# Patient Record
Sex: Male | Born: 1980 | Race: White | Hispanic: No | Marital: Single | Smoking: Current every day smoker
Health system: Southern US, Community
[De-identification: ages and names within clinical notes are randomized; demographics above are authoritative.]

## PROBLEM LIST (undated history)

## (undated) DIAGNOSIS — K649 Unspecified hemorrhoids: Secondary | ICD-10-CM

## (undated) DIAGNOSIS — Z950 Presence of cardiac pacemaker: Secondary | ICD-10-CM

## (undated) HISTORY — PX: LEG SURGERY: SHX1003

## (undated) HISTORY — PX: HERNIA REPAIR: SHX51

---

## 2009-10-28 ENCOUNTER — Emergency Department (HOSPITAL_COMMUNITY)
Admission: EM | Admit: 2009-10-28 | Discharge: 2009-10-28 | Payer: Self-pay | Source: Home / Self Care | Admitting: Emergency Medicine

## 2010-02-11 ENCOUNTER — Emergency Department (HOSPITAL_COMMUNITY)
Admission: EM | Admit: 2010-02-11 | Discharge: 2010-02-11 | Payer: Self-pay | Source: Home / Self Care | Admitting: Emergency Medicine

## 2010-03-19 ENCOUNTER — Emergency Department (HOSPITAL_COMMUNITY)
Admission: EM | Admit: 2010-03-19 | Discharge: 2010-03-19 | Disposition: A | Discharge status: Home / Self Care | Payer: Self-pay | Attending: Emergency Medicine | Admitting: Emergency Medicine

## 2010-03-19 DIAGNOSIS — K4090 Unilateral inguinal hernia, without obstruction or gangrene, not specified as recurrent: Secondary | ICD-10-CM | POA: Insufficient documentation

## 2010-04-02 ENCOUNTER — Emergency Department (HOSPITAL_COMMUNITY): Payer: Self-pay

## 2010-04-02 ENCOUNTER — Emergency Department (HOSPITAL_COMMUNITY)
Admission: EM | Admit: 2010-04-02 | Discharge: 2010-04-02 | Disposition: A | Discharge status: Home / Self Care | Payer: Self-pay | Attending: Emergency Medicine | Admitting: Emergency Medicine

## 2010-04-02 DIAGNOSIS — R1110 Vomiting, unspecified: Secondary | ICD-10-CM | POA: Insufficient documentation

## 2010-04-02 DIAGNOSIS — R109 Unspecified abdominal pain: Secondary | ICD-10-CM | POA: Insufficient documentation

## 2010-04-02 DIAGNOSIS — F319 Bipolar disorder, unspecified: Secondary | ICD-10-CM | POA: Insufficient documentation

## 2010-04-02 DIAGNOSIS — K4090 Unilateral inguinal hernia, without obstruction or gangrene, not specified as recurrent: Secondary | ICD-10-CM | POA: Insufficient documentation

## 2010-04-02 DIAGNOSIS — F988 Other specified behavioral and emotional disorders with onset usually occurring in childhood and adolescence: Secondary | ICD-10-CM | POA: Insufficient documentation

## 2010-04-17 ENCOUNTER — Other Ambulatory Visit (HOSPITAL_COMMUNITY): Payer: Self-pay

## 2010-04-18 ENCOUNTER — Other Ambulatory Visit: Payer: Self-pay | Admitting: General Surgery

## 2010-04-18 ENCOUNTER — Encounter (HOSPITAL_COMMUNITY): Payer: Self-pay | Attending: General Surgery

## 2010-04-18 DIAGNOSIS — Z01812 Encounter for preprocedural laboratory examination: Secondary | ICD-10-CM | POA: Insufficient documentation

## 2010-04-18 DIAGNOSIS — K4091 Unilateral inguinal hernia, without obstruction or gangrene, recurrent: Secondary | ICD-10-CM | POA: Insufficient documentation

## 2010-04-18 LAB — SURGICAL PCR SCREEN
MRSA, PCR: NEGATIVE
Staphylococcus aureus: POSITIVE — AB

## 2010-04-18 LAB — BASIC METABOLIC PANEL
Chloride: 108 mEq/L (ref 96–112)
GFR calc Af Amer: >60 mL/min (ref >60)
GFR calc non Af Amer: >60 mL/min (ref >60)
Potassium: 4.2 mEq/L (ref 3.5–5.1)

## 2010-04-18 LAB — CBC
Platelets: 140 10*3/uL — ABNORMAL LOW (ref 150–400)
RBC: 4.8 MIL/uL (ref 4.22–5.81)
WBC: 5.2 10*3/uL (ref 4.0–10.5)

## 2010-04-19 ENCOUNTER — Ambulatory Visit (HOSPITAL_COMMUNITY)
Admission: RE | Admit: 2010-04-19 | Discharge: 2010-04-19 | Disposition: A | Discharge status: Home / Self Care | Payer: Self-pay | Source: Ambulatory Visit | Attending: General Surgery | Admitting: General Surgery

## 2010-04-19 DIAGNOSIS — K4090 Unilateral inguinal hernia, without obstruction or gangrene, not specified as recurrent: Secondary | ICD-10-CM | POA: Insufficient documentation

## 2010-04-19 NOTE — H&P (Signed)
  Cameron Wood, Cameron Wood NO.:  [id number]  MEDICAL RECORD NO.:  [id number]           PATIENT TYPE:  LOCATION:                                 FACILITY:  PHYSICIAN:  Dalia Heading, M.D.  DATE OF BIRTH:  April 22, 1980  DATE OF ADMISSION: DATE OF DISCHARGE:  LH                             HISTORY & PHYSICAL   CHIEF COMPLAINT:  Left inguinal hernia.  HISTORY OF PRESENT ILLNESS:  The patient is a 30 year old white male who is referred for evaluation and treatment of the left inguinal hernia. It has been present for 1 month.  It is made worse with straining.  PAST MEDICAL HISTORY:  Unremarkable.  PAST SURGICAL HISTORY:  Left leg surgery.  CURRENT MEDICATIONS:  None.  ALLERGIES:  No known drug allergies.  REVIEW OF SYSTEMS:  The patient smokes a pack and half cigarettes a day. He denies any significant alcohol use.  He denies any other cardiopulmonary difficulties or bleeding disorders.  FAMILY MEDICAL HISTORY:  Noncontributory.  PHYSICAL EXAMINATION:  GENERAL:  The patient is a well-developed, well- nourished white male, in no acute distress. HEENT:  No scleral icterus. NECK:  Supple without lymphadenopathy. LUNGS:  Clear to auscultation with equal breath sounds bilaterally. HEART:  Regular rate and rhythm without S3, S4, or murmurs. ABDOMEN:  Soft, nontender, nondistended.  No hepatosplenomegaly or masses are noted.  Reducible left inguinal hernia is present. GENITOURINARY:  Within normal limits.  IMPRESSION:  Left inguinal hernia.  PLAN:  The patient is scheduled for a left inguinal herniorrhaphy on April 19, 2010.  The risks and benefits of the procedure including bleeding, infection, and recurrence of the hernia were fully explained to the patient, gave informed consent.     Dalia Heading, M.D.     MAJ/MEDQ  D:  04/18/2010  T:  04/19/2010  Job:  [id number]  Electronically Signed by Franky Macho M.D. on 04/19/2010 11:08:54 AM

## 2010-04-21 ENCOUNTER — Emergency Department (HOSPITAL_COMMUNITY)
Admission: EM | Admit: 2010-04-21 | Discharge: 2010-04-22 | Disposition: A | Discharge status: Home / Self Care | Payer: Self-pay | Attending: Emergency Medicine | Admitting: Emergency Medicine

## 2010-04-21 DIAGNOSIS — F319 Bipolar disorder, unspecified: Secondary | ICD-10-CM | POA: Insufficient documentation

## 2010-04-21 DIAGNOSIS — F988 Other specified behavioral and emotional disorders with onset usually occurring in childhood and adolescence: Secondary | ICD-10-CM | POA: Insufficient documentation

## 2010-04-21 DIAGNOSIS — R110 Nausea: Secondary | ICD-10-CM | POA: Insufficient documentation

## 2010-04-21 DIAGNOSIS — X58XXXA Exposure to other specified factors, initial encounter: Secondary | ICD-10-CM | POA: Insufficient documentation

## 2010-04-21 DIAGNOSIS — R109 Unspecified abdominal pain: Secondary | ICD-10-CM | POA: Insufficient documentation

## 2010-04-21 DIAGNOSIS — Y929 Unspecified place or not applicable: Secondary | ICD-10-CM | POA: Insufficient documentation

## 2010-04-21 DIAGNOSIS — Z9889 Other specified postprocedural states: Secondary | ICD-10-CM | POA: Insufficient documentation

## 2010-04-21 DIAGNOSIS — IMO0002 Reserved for concepts with insufficient information to code with codable children: Secondary | ICD-10-CM | POA: Insufficient documentation

## 2010-04-24 NOTE — Op Note (Signed)
  NAMEJAQWON, MANFRED NO.:  [id number]  MEDICAL RECORD NO.:  [id number]           PATIENT TYPE:  O  LOCATION:  DOIB                          FACILITY:  [facility]  PHYSICIAN:  Dalia Heading, M.D.  DATE OF BIRTH:  [birth year]  DATE OF PROCEDURE:  04/19/2010 DATE OF DISCHARGE:                              OPERATIVE REPORT   PREOPERATIVE DIAGNOSIS:  Left inguinal hernia.  POSTOPERATIVE DIAGNOSIS:  Left inguinal hernia, indirect.  PROCEDURE:  Left inguinal herniorrhaphy.  SURGEON:  Dalia Heading, MD  ANESTHESIA:  General.  INDICATIONS:  The patient is a 30 year old white male who presents with a symptomatic left inguinal hernia.  The risks and benefits of the procedure including bleeding, infection, pain, and recurrence of the hernia were fully explained to the patient, gave informed consent.  PROCEDURE NOTE:  The patient was placed in supine position.  After general anesthesia was administered, the left groin region was prepped and draped using the usual sterile technique with Betadine.  Surgical site confirmation was performed.  A transverse incision was made in the left groin region down to the external oblique aponeurosis.  The aponeurosis was incised to the external ring.  The ilioinguinal nerve was identified and retracted inferiorly from the operative field.  Penrose drain was placed around the spermatic cord.  The vase deferens was noted within the spermatic cord.  A lipoma of cord was excised.  An indirect hernia was found. This was freed away from the spermatic cord up to the perineal reflection and inverted.  A medium-size Bard mesh plug was then placed in this region and secured to the transversalis fascia using a 2-0 Novafil interrupted suture.  An onlay patch was then placed along the floor of the inguinal canal and secured superiorly to the conjoined tendon and inferiorly to the shelving edge Poupart's ligament using 2-0 Novafil interrupted  sutures.  The internal ring was recreated using a 2- 0 Novafil interrupted suture.  The external oblique aponeurosis was reapproximated using 2-0 Vicryl running suture.  Subcutaneous layer was reapproximated using 3-0 Vicryl interrupted sutures.  The skin was closed using a 4-0 Vicryl subcuticular suture.  A 0.5 cm Sensorcaine was instilled in the surrounding wound.  Dermabond was then applied.  All tape and needle counts were correct at the end of the procedure. The patient was awakened and transferred to PACU in stable condition.  COMPLICATIONS:  None.  SPECIMEN:  None.  BLOOD LOSS:  Minimal.     Dalia Heading, M.D.     MAJ/MEDQ  D:  04/19/2010  T:  04/20/2010  Job:  [id number]  Electronically Signed by Franky Macho M.D. on 04/24/2010 08:50:12 AM

## 2011-07-10 ENCOUNTER — Emergency Department (HOSPITAL_COMMUNITY)
Admission: EM | Admit: 2011-07-10 | Discharge: 2011-07-11 | Disposition: A | Discharge status: Home / Self Care | Payer: Self-pay | Attending: Emergency Medicine | Admitting: Emergency Medicine

## 2011-07-10 ENCOUNTER — Encounter (HOSPITAL_COMMUNITY): Payer: Self-pay | Admitting: Emergency Medicine

## 2011-07-10 DIAGNOSIS — K0889 Other specified disorders of teeth and supporting structures: Secondary | ICD-10-CM

## 2011-07-10 DIAGNOSIS — K029 Dental caries, unspecified: Secondary | ICD-10-CM | POA: Insufficient documentation

## 2011-07-10 DIAGNOSIS — F17200 Nicotine dependence, unspecified, uncomplicated: Secondary | ICD-10-CM | POA: Insufficient documentation

## 2011-07-10 NOTE — ED Notes (Signed)
Patient c/o right lower back tooth pain for months.  Patient has broken molar noted.

## 2011-07-11 MED ORDER — PENICILLIN V POTASSIUM 250 MG PO TABS
500.0000 mg | ORAL_TABLET | Freq: Once | ORAL | Status: AC
  Administered 2011-07-11: 500 mg via ORAL
  Filled 2011-07-11: qty 2

## 2011-07-11 MED ORDER — IBUPROFEN 800 MG PO TABS
800.0000 mg | ORAL_TABLET | Freq: Once | ORAL | Status: AC
  Administered 2011-07-11: 800 mg via ORAL
  Filled 2011-07-11: qty 1

## 2011-07-11 MED ORDER — HYDROCODONE-ACETAMINOPHEN 5-325 MG PO TABS: 1.0000 | ORAL_TABLET | ORAL | Status: AC | PRN

## 2011-07-11 MED ORDER — IBUPROFEN 800 MG PO TABS: 800.0000 mg | ORAL_TABLET | Freq: Three times a day (TID) | ORAL | Status: AC

## 2011-07-11 MED ORDER — HYDROCODONE-ACETAMINOPHEN 5-325 MG PO TABS
1.0000 | ORAL_TABLET | Freq: Once | ORAL | Status: AC
  Administered 2011-07-11: 1 via ORAL
  Filled 2011-07-11: qty 1

## 2011-07-11 MED ORDER — PENICILLIN V POTASSIUM 500 MG PO TABS: 500.0000 mg | ORAL_TABLET | Freq: Four times a day (QID) | ORAL | Status: AC

## 2011-07-11 NOTE — Discharge Instructions (Signed)
Dental Pain Toothache is pain in or around a tooth. It may get worse with chewing or with cold or heat.  HOME CARE  Your dentist may use a numbing medicine during treatment. If so, you may need to avoid eating until the medicine wears off. Ask your dentist about this.   Only take medicine as told by your dentist or doctor.   Avoid chewing food near the painful tooth until after all treatment is done. Ask your dentist about this.  GET HELP RIGHT AWAY IF:   The problem gets worse or new problems appear.   You have a fever.   There is redness and puffiness (swelling) of the face, jaw, or neck.   You cannot open your mouth.   There is pain in the jaw.   There is very bad pain that is not helped by medicine.  MAKE SURE YOU:   Understand these instructions.   Will watch your condition.   Will get help right away if you are not doing well or get worse.  Document Released: 06/20/2007 Document Revised: 12/21/2010 Document Reviewed: 06/20/2007 [facility] Patient Information 2012 [place], [place].

## 2011-07-11 NOTE — ED Provider Notes (Signed)
History     CSN: [id number]  Arrival date & time 07/10/11  2320   First MD Initiated Contact with Patient 07/10/11 2334      Chief Complaint  Patient presents with  . Dental Pain    (Consider location/radiation/quality/duration/timing/severity/associated sxs/prior treatment) HPI Cameron Wood is a 31 y.o. male who presents to the Emergency Department complaining of dental pain that has been present for several weeks. He has one molar on the right lower jaw that is blackened, broken and the source of his pain. He denies fever, chills, difficulty talking or swallowing. Pain is worse with chewing. He is taking BC powders with no relief.   History reviewed. No pertinent past medical history.  Past Surgical History  Procedure Date  . Hernia repair     No family history on file.  History  Substance Use Topics  . Smoking status: Current Everyday Smoker  . Smokeless tobacco: Not on file  . Alcohol Use: Yes     occ      Review of Systems  Constitutional: Negative for fever.       10 Systems reviewed and are negative for acute change except as noted in the HPI.  HENT: Negative for congestion.        Dental pain  Eyes: Negative for discharge and redness.  Respiratory: Negative for cough and shortness of breath.   Cardiovascular: Negative for chest pain.  Gastrointestinal: Negative for vomiting and abdominal pain.  Musculoskeletal: Negative for back pain.  Skin: Negative for rash.  Neurological: Negative for syncope, numbness and headaches.  Psychiatric/Behavioral:       No behavior change.    Allergies  Review of patient's allergies indicates no known allergies.  Home Medications  No current outpatient prescriptions on file.  BP 130/86  Pulse 88  Temp 99 F (37.2 C) (Oral)  Resp 20  Ht 6' (1.829 m)  Wt 170 lb (77.111 kg)  BMI 23.06 kg/m2  SpO2 97%  Physical Exam  Nursing note and vitals reviewed. Constitutional: He appears well-developed and  well-nourished. No distress.       Awake, alert, nontoxic appearance.  HENT:  Head: Atraumatic.       Poor dentition. Multiple caries. Sole tooth on right lower jaw blackened, broken, no obvious abscess, no drainage, no swollen gums.  Eyes: Right eye exhibits no discharge. Left eye exhibits no discharge.  Neck: Neck supple.  Cardiovascular: Normal heart sounds.   Pulmonary/Chest: Effort normal and breath sounds normal. He exhibits no tenderness.  Abdominal: Soft. There is no tenderness. There is no rebound.  Musculoskeletal: He exhibits no tenderness.       Baseline ROM, no obvious new focal weakness.  Neurological:       Mental status and motor strength appears baseline for patient and situation.  Skin: No rash noted.  Psychiatric: He has a normal mood and affect.    ED Course  Procedures (including critical care time)     MDM  Patient with poor dentition here with pain to the right lower jaw molar is broken and blackened. Given antibiotics, anti-inflammatory, and analgesic. Referral list of dentists given to the patient.Pt stable in ED with no significant deterioration in condition.The patient appears reasonably screened and/or stabilized for discharge and I doubt any other medical condition or other [facility] requiring further screening, evaluation, or treatment in the ED at this time prior to discharge.  MDM Reviewed: nursing note and vitals          Aurther Loft  Velia Meyer, MD 07/11/11 [phone]

## 2011-08-14 DIAGNOSIS — F17200 Nicotine dependence, unspecified, uncomplicated: Secondary | ICD-10-CM | POA: Insufficient documentation

## 2011-08-14 DIAGNOSIS — K029 Dental caries, unspecified: Secondary | ICD-10-CM | POA: Insufficient documentation

## 2011-08-15 ENCOUNTER — Encounter (HOSPITAL_COMMUNITY): Payer: Self-pay | Admitting: Emergency Medicine

## 2011-08-15 ENCOUNTER — Emergency Department (HOSPITAL_COMMUNITY)
Admission: EM | Admit: 2011-08-15 | Discharge: 2011-08-15 | Disposition: A | Discharge status: Home / Self Care | Payer: Self-pay | Attending: Emergency Medicine | Admitting: Emergency Medicine

## 2011-08-15 DIAGNOSIS — T85848A Pain due to other internal prosthetic devices, implants and grafts, initial encounter: Secondary | ICD-10-CM

## 2011-08-15 MED ORDER — IBUPROFEN 800 MG PO TABS
800.0000 mg | ORAL_TABLET | Freq: Once | ORAL | Status: AC
  Administered 2011-08-15: 800 mg via ORAL
  Filled 2011-08-15: qty 1

## 2011-08-15 MED ORDER — HYDROCODONE-ACETAMINOPHEN 5-325 MG PO TABS
1.0000 | ORAL_TABLET | Freq: Once | ORAL | Status: AC
  Administered 2011-08-15: 1 via ORAL
  Filled 2011-08-15: qty 1

## 2011-08-15 MED ORDER — HYDROCODONE-ACETAMINOPHEN 5-325 MG PO TABS: 1.0000 | ORAL_TABLET | ORAL | Status: AC | PRN

## 2011-08-15 NOTE — ED Notes (Signed)
Discharge instructions reviewed with pt, questions answered. Pt verbalized understanding.  

## 2011-08-15 NOTE — ED Notes (Signed)
Patient complaining of lower right dental pain since yesterday. 

## 2011-08-15 NOTE — ED Provider Notes (Signed)
History     CSN: [id number]  Arrival date & time 08/14/11  2357   First MD Initiated Contact with Patient 08/15/11 0019      Chief Complaint  Patient presents with  . Dental Pain    (Consider location/radiation/quality/duration/timing/severity/associated sxs/prior treatment) HPI  Cameron Wood is a 31 y.o. male who presents to the Emergency Department complaining of renewed pain to the right lower molar that is broken, blackened and been the source of dental pain for several months. He was seen for similar pain on 07/10/11 when he was given antibiotics, antiinflammatory and analgesic. He did not finish the antibiotic because it made him feel "weird". He had been given a list of dentists in the area and has not followed up with a dentist. He is here with pain that became worse yesterday.    History reviewed. No pertinent past medical history.  Past Surgical History  Procedure Date  . Hernia repair     History reviewed. No pertinent family history.  History  Substance Use Topics  . Smoking status: Current Everyday Smoker  . Smokeless tobacco: Not on file  . Alcohol Use: Yes     occ      Review of Systems  Constitutional: Negative for fever.       10 Systems reviewed and are negative for acute change except as noted in the HPI.  HENT: Negative for congestion.        Right lower tooth pain  Eyes: Negative for discharge and redness.  Respiratory: Negative for cough and shortness of breath.   Cardiovascular: Negative for chest pain.  Gastrointestinal: Negative for vomiting and abdominal pain.  Musculoskeletal: Negative for back pain.  Skin: Negative for rash.  Neurological: Negative for syncope, numbness and headaches.  Psychiatric/Behavioral:       No behavior change.    Allergies  Penicillins  Home Medications  No current outpatient prescriptions on file.  BP 149/88  Pulse 79  Temp 98.3 F (36.8 C) (Oral)  Resp 18  Ht 6' (1.829 m)  Wt 170 lb (77.111  kg)  BMI 23.06 kg/m2  SpO2 96%  Physical Exam  Nursing note and vitals reviewed. Constitutional:       Awake, alert, nontoxic appearance.  HENT:  Head: Atraumatic.       Poor dentition with multiple caries. Right lower 2nd molar broken, blackened. No obvious abscess noted. No drainage. No swollen gums.  Eyes: Right eye exhibits no discharge. Left eye exhibits no discharge.  Neck: Neck supple.  Cardiovascular: Normal heart sounds.   Pulmonary/Chest: Effort normal and breath sounds normal. He exhibits no tenderness.  Abdominal: Soft. There is no tenderness. There is no rebound.  Musculoskeletal: He exhibits no tenderness.       Baseline ROM, no obvious new focal weakness.  Neurological:       Mental status and motor strength appears baseline for patient and situation.  Skin: No rash noted.  Psychiatric: He has a normal mood and affect.    ED Course  Procedures (including critical care time)     MDM  Patient with poor dentition and recurrent pain. Given a referral list of dentist. Received an antiinflammatory and analgesic. Rx for analgesic.Pt stable in ED with no significant deterioration in condition.The patient appears reasonably screened and/or stabilized for discharge and I doubt any other medical condition or other [facility] requiring further screening, evaluation, or treatment in the ED at this time prior to discharge.  MDM Reviewed: nursing note, vitals and  previous chart           Nicoletta Dress. Colon Branch, MD 08/15/11 4540

## 2011-10-02 ENCOUNTER — Emergency Department (HOSPITAL_COMMUNITY)
Admission: EM | Admit: 2011-10-02 | Discharge: 2011-10-02 | Disposition: A | Discharge status: Home / Self Care | Payer: Self-pay | Attending: Emergency Medicine | Admitting: Emergency Medicine

## 2011-10-02 ENCOUNTER — Encounter (HOSPITAL_COMMUNITY): Payer: Self-pay

## 2011-10-02 DIAGNOSIS — R109 Unspecified abdominal pain: Secondary | ICD-10-CM

## 2011-10-02 DIAGNOSIS — F17200 Nicotine dependence, unspecified, uncomplicated: Secondary | ICD-10-CM | POA: Insufficient documentation

## 2011-10-02 DIAGNOSIS — R1012 Left upper quadrant pain: Secondary | ICD-10-CM | POA: Insufficient documentation

## 2011-10-02 LAB — CBC WITH DIFFERENTIAL/PLATELET
Basophils Relative: 1 % (ref 0–1)
Hemoglobin: 14.6 g/dL (ref 13.0–17.0)
Lymphs Abs: 2.7 10*3/uL (ref 0.7–4.0)
MCHC: 34.5 g/dL (ref 30.0–36.0)
Monocytes Relative: 6 % (ref 3–12)
Neutro Abs: 3.1 10*3/uL (ref 1.7–7.7)
Neutrophils Relative %: 45 % (ref 43–77)
Platelets: 151 10*3/uL (ref 150–400)
RBC: 4.95 MIL/uL (ref 4.22–5.81)

## 2011-10-02 LAB — URINALYSIS, ROUTINE W REFLEX MICROSCOPIC
Hgb urine dipstick: NEGATIVE
Leukocytes, UA: NEGATIVE
Protein, ur: NEGATIVE mg/dL
Specific Gravity, Urine: >1.03 — ABNORMAL HIGH (ref 1.005–1.030)
Urobilinogen, UA: 0.2 mg/dL (ref 0.0–1.0)

## 2011-10-02 LAB — COMPREHENSIVE METABOLIC PANEL
ALT: 23 U/L (ref 0–53)
AST: 19 U/L (ref 0–37)
Albumin: 4.5 g/dL (ref 3.5–5.2)
Alkaline Phosphatase: 73 U/L (ref 39–117)
Calcium: 9.7 mg/dL (ref 8.4–10.5)
GFR calc Af Amer: >90 mL/min (ref >90)
Glucose, Bld: 87 mg/dL (ref 70–99)
Potassium: 4 mEq/L (ref 3.5–5.1)
Sodium: 139 mEq/L (ref 135–145)
Total Protein: 7.7 g/dL (ref 6.0–8.3)

## 2011-10-02 MED ORDER — HYDROCODONE-ACETAMINOPHEN 5-325 MG PO TABS: 1.0000 | ORAL_TABLET | ORAL | Status: DC | PRN

## 2011-10-02 MED ORDER — ONDANSETRON HCL 4 MG/2ML IJ SOLN
4.0000 mg | Freq: Once | INTRAMUSCULAR | Status: AC
  Administered 2011-10-02: 4 mg via INTRAVENOUS
  Filled 2011-10-02: qty 2

## 2011-10-02 MED ORDER — HYDROMORPHONE HCL PF 1 MG/ML IJ SOLN
1.0000 mg | Freq: Once | INTRAMUSCULAR | Status: AC
  Administered 2011-10-02: 1 mg via INTRAVENOUS
  Filled 2011-10-02: qty 1

## 2011-10-02 MED ORDER — SODIUM CHLORIDE 0.9 % IV BOLUS (SEPSIS)
1000.0000 mL | Freq: Once | INTRAVENOUS | Status: AC
  Administered 2011-10-02: 1000 mL via INTRAVENOUS

## 2011-10-02 MED ORDER — SODIUM CHLORIDE 0.9 % IV SOLN: INTRAVENOUS | Status: DC

## 2011-10-02 NOTE — ED Provider Notes (Signed)
History     CSN: [id number]  Arrival date & time 10/02/11  1721   First MD Initiated Contact with Patient 10/02/11 1813      Chief Complaint  Patient presents with  . Abdominal Pain  . Nausea    (Consider location/radiation/quality/duration/timing/severity/associated sxs/prior treatment) HPI  Vague left upper quadrant pain for 24 hours.  No fever, nausea, vomiting, diarrhea.  Decreased appetite today. Nothing makes symptoms better or worse. Severity is mild to moderate. History reviewed. No pertinent past medical history.  Past Surgical History  Procedure Date  . Hernia repair   . Leg surgery     No family history on file.  History  Substance Use Topics  . Smoking status: Current Every Day Smoker  . Smokeless tobacco: Not on file  . Alcohol Use: Yes     occ      Review of Systems  All other systems reviewed and are negative.    Allergies  Penicillins  Home Medications   Current Outpatient Rx  Name Route Sig Dispense Refill  . HYDROCODONE-ACETAMINOPHEN 5-325 MG PO TABS Oral Take 1-2 tablets by mouth every 4 (four) hours as needed for pain. 15 tablet 0    BP 116/66  Pulse 52  Temp 98.5 F (36.9 C) (Oral)  Resp 18  Ht 5\' 11"  (1.803 m)  Wt 160 lb (72.576 kg)  BMI 22.32 kg/m2  SpO2 97%  Physical Exam  Nursing note and vitals reviewed. Constitutional: He is oriented to person, place, and time. He appears well-developed and well-nourished.  HENT:  Head: Normocephalic and atraumatic.  Eyes: Conjunctivae normal and EOM are normal. Pupils are equal, round, and reactive to light.  Neck: Normal range of motion. Neck supple.  Cardiovascular: Normal rate, regular rhythm and normal heart sounds.   Pulmonary/Chest: Effort normal and breath sounds normal.  Abdominal:       Very minimal left upper quadrant pain  Musculoskeletal: Normal range of motion.  Neurological: He is alert and oriented to person, place, and time.  Skin: Skin is warm and dry.    Psychiatric: He has a normal mood and affect.    ED Course  Procedures (including critical care time)  Labs Reviewed  CBC WITH DIFFERENTIAL - Abnormal; Notable for the following:    Eosinophils Relative 8 (*)     All other components within normal limits  URINALYSIS, ROUTINE W REFLEX MICROSCOPIC - Abnormal; Notable for the following:    Specific Gravity, Urine >1.030 (*)     All other components within normal limits  COMPREHENSIVE METABOLIC PANEL  LIPASE, BLOOD   No results found.   1. Abdominal pain       MDM  No acute abdomen. Screening tests were negative. Patient is ambulatory without obvious pain.  Normal vital signs        Donnetta Hutching, MD 10/02/11 2135

## 2011-10-02 NOTE — ED Notes (Signed)
Pt reports took 3 ibuprofen last night for toothache, says didn't help so he took one of his mother's vicodin.  Reports history of bloody stools but non today or yesterday.  Says has been having blood in stool intermittently since his surgery.

## 2011-10-02 NOTE — ED Notes (Signed)
Pt c/o abd pain with nausea since yesterday.   Denies vomiting.  Reports diarrhea after every meal since having hernia repaired 2 years ago.

## 2011-10-03 ENCOUNTER — Emergency Department (HOSPITAL_COMMUNITY)
Admission: EM | Admit: 2011-10-03 | Discharge: 2011-10-03 | Disposition: A | Discharge status: Home / Self Care | Payer: Self-pay | Attending: Emergency Medicine | Admitting: Emergency Medicine

## 2011-10-03 ENCOUNTER — Emergency Department (HOSPITAL_COMMUNITY): Payer: Self-pay

## 2011-10-03 ENCOUNTER — Encounter (HOSPITAL_COMMUNITY): Payer: Self-pay | Admitting: Emergency Medicine

## 2011-10-03 DIAGNOSIS — R112 Nausea with vomiting, unspecified: Secondary | ICD-10-CM | POA: Insufficient documentation

## 2011-10-03 DIAGNOSIS — R109 Unspecified abdominal pain: Secondary | ICD-10-CM

## 2011-10-03 DIAGNOSIS — K279 Peptic ulcer, site unspecified, unspecified as acute or chronic, without hemorrhage or perforation: Secondary | ICD-10-CM | POA: Insufficient documentation

## 2011-10-03 LAB — CBC WITH DIFFERENTIAL/PLATELET
Basophils Relative: 1 % (ref 0–1)
Eosinophils Absolute: 0.5 10*3/uL (ref 0.0–0.7)
HCT: 38.7 % — ABNORMAL LOW (ref 39.0–52.0)
Hemoglobin: 13.2 g/dL (ref 13.0–17.0)
Lymphs Abs: 1.7 10*3/uL (ref 0.7–4.0)
MCH: 29.1 pg (ref 26.0–34.0)
MCHC: 34.1 g/dL (ref 30.0–36.0)
MCV: 85.4 fL (ref 78.0–100.0)
Monocytes Absolute: 0.4 10*3/uL (ref 0.1–1.0)
Monocytes Relative: 6 % (ref 3–12)
Neutrophils Relative %: 58 % (ref 43–77)
RBC: 4.53 MIL/uL (ref 4.22–5.81)

## 2011-10-03 LAB — COMPREHENSIVE METABOLIC PANEL
Albumin: 3.8 g/dL (ref 3.5–5.2)
Alkaline Phosphatase: 63 U/L (ref 39–117)
BUN: 11 mg/dL (ref 6–23)
Creatinine, Ser: 0.96 mg/dL (ref 0.50–1.35)
GFR calc Af Amer: >90 mL/min (ref >90)
Glucose, Bld: 92 mg/dL (ref 70–99)
Potassium: 4 mEq/L (ref 3.5–5.1)
Total Bilirubin: 0.4 mg/dL (ref 0.3–1.2)
Total Protein: 6.8 g/dL (ref 6.0–8.3)

## 2011-10-03 LAB — LIPASE, BLOOD: Lipase: 24 U/L (ref 11–59)

## 2011-10-03 MED ORDER — HYDROMORPHONE HCL PF 1 MG/ML IJ SOLN
1.0000 mg | Freq: Once | INTRAMUSCULAR | Status: AC
  Administered 2011-10-03: 1 mg via INTRAVENOUS
  Filled 2011-10-03: qty 1

## 2011-10-03 MED ORDER — ONDANSETRON HCL 4 MG/2ML IJ SOLN
4.0000 mg | Freq: Once | INTRAMUSCULAR | Status: AC
  Administered 2011-10-03: 4 mg via INTRAVENOUS
  Filled 2011-10-03: qty 2

## 2011-10-03 MED ORDER — OXYCODONE-ACETAMINOPHEN 5-325 MG PO TABS: 1.0000 | ORAL_TABLET | Freq: Four times a day (QID) | ORAL | Status: AC | PRN

## 2011-10-03 MED ORDER — RANITIDINE HCL 150 MG PO CAPS: 150.0000 mg | ORAL_CAPSULE | Freq: Two times a day (BID) | ORAL | Status: AC

## 2011-10-03 MED ORDER — IOHEXOL 300 MG/ML  SOLN
100.0000 mL | Freq: Once | INTRAMUSCULAR | Status: AC | PRN
  Administered 2011-10-03: 100 mL via INTRAVENOUS

## 2011-10-03 MED ORDER — PANTOPRAZOLE SODIUM 40 MG IV SOLR
40.0000 mg | Freq: Once | INTRAVENOUS | Status: AC
  Administered 2011-10-03: 40 mg via INTRAVENOUS
  Filled 2011-10-03: qty 40

## 2011-10-03 NOTE — Discharge Instructions (Signed)
Peptic Ulcers Ulcers are small, open, and painful sores that form in the lining of the stomach (gastric ulcers). They can also form in the first part of the small intestine (duodenal ulcers). A peptic ulcer describes both types of ulcers. Ulcers often heal by taking medicine or changing what you eat. Surgery is only needed if the pain cannot be controlled or if tearing, blockage, or bleeding is found. HOME CARE  Stop smoking.   Avoid alcohol.  Avoid aspirin and drugs that lessen puffiness (swelling) and soreness. Peptic Ulcers Ulcers are small, open, and painful sores that form in the lining of the stomach (gastric ulcers). They can also form in the first part of the small intestine (duodenal ulcers). A peptic ulcer describes both types of ulcers. Ulcers often heal by taking medicine or changing what you eat. Surgery is only needed if the pain cannot be controlled or if tearing, blockage, or bleeding is found. HOME CARE Stop smoking.  Avoid alcohol.  Avoid aspirin and drugs that lessen puffiness (swelling) and soreness.  Eat regular, healthy meals.  Avoid foods that bother you.  Only take medicine as told by your doctor.  Always ask your doctor first before switching brands of antacid medicine.  GET HELP RIGHT AWAY IF: You have bright red blood in your throw up (vomit).  You have bloody, tarry, or black looking poop (stool).  You feel weak, tired, or pass out (faint).  You have sudden belly (abdominal) pain that hurts really bad.  You have bad pain and keep throwing up.  MAKE SURE YOU:  Understand these instructions.  Will watch your condition.  Will get help right away if you are not doing well or get worse.  Document Released: 03/28/2009 Document Revised: 12/21/2010 Document Reviewed: 03/28/2009 [facility] Patient Information 2012 [place], [place].Peptic Ulcers Ulcers are small, open, and painful sores that form in the lining of the stomach (gastric ulcers). They can also form in the  first part of the small intestine (duodenal ulcers). A peptic ulcer describes both types of ulcers. Ulcers often heal by taking medicine or changing what you eat. Surgery is only needed if the pain cannot be controlled or if tearing, blockage, or bleeding is found. HOME CARE Stop smoking.  Avoid alcohol.  Avoid aspirin and drugs that lessen puffiness (swelling) and soreness.  Eat regular, healthy meals.  Avoid foods that bother you.  Only take medicine as told by your doctor.  Always ask your doctor first before switching brands of antacid medicine.  GET HELP RIGHT AWAY IF: You have bright red blood in your throw up (vomit).  You have bloody, tarry, or black looking poop (stool).  You feel weak, tired, or pass out (faint).  You have sudden belly (abdominal) pain that hurts really bad.  You have bad pain and keep throwing up.  MAKE SURE YOU:  Understand these instructions.  Will watch your condition.  Will get help right away if you are not doing well or get worse.  Document Released: 03/28/2009 Document Revised: 12/21/2010 Document Reviewed: 03/28/2009  [facility] Patient Information 2012 [place], [place].  Eat regular, healthy meals.   Avoid foods that bother you.   Only take medicine as told by your doctor.   Always ask your doctor first before switching brands of antacid medicine.  GET HELP RIGHT AWAY IF:  You have bright red blood in your throw up (vomit).   You have bloody, tarry, or black looking poop (stool).   You feel weak, tired, or pass  out (faint).   You have sudden belly (abdominal) pain that hurts really bad.   You have bad pain and keep throwing up.  MAKE SURE YOU:   Understand these instructions.   Will watch your condition.   Will get help right away if you are not doing well or get worse.  Document Released: 03/28/2009 Document Revised: 12/21/2010 Document Reviewed: 03/28/2009 [facility] Patient Information 2012 [place], [place].

## 2011-10-03 NOTE — ED Provider Notes (Signed)
History  This chart was scribed for [name], MD by Shari Heritage. The patient was seen in room APA06/APA06. Patient's care was started at 1112.     CSN: [id number]  Arrival date & time 10/03/11  1003   First MD Initiated Contact with Patient 10/03/11 1112      Chief Complaint  Patient presents with  . Abdominal Pain    Patient is a 31 y.o. male presenting with abdominal pain. The history is provided by the patient and medical records. No language interpreter was used.  Abdominal Pain The primary symptoms of the illness include abdominal pain, nausea and vomiting. The primary symptoms of the illness do not include fatigue or diarrhea. The current episode started 2 days ago. The onset of the illness was sudden. The problem has not changed since onset. The abdominal pain began 2 days ago. The pain came on suddenly. The abdominal pain has been unchanged since its onset. The abdominal pain is generalized. The abdominal pain does not radiate. The abdominal pain is relieved by nothing.  The vomiting began today. Vomiting occurred once. The emesis contains bilious material.  Symptoms associated with the illness do not include hematuria, frequency or back pain.    Cameron Wood is a 31 y.o. male who presents to the Emergency Department complaining of moderate, constant, non-radiating diffuse abdominal pain onset 2 days ago. Associated symptoms include nausea and 1 episode of emesis containing "acid." Patient reports no aggravating or relieving factors. Patient reports no significant medical history. He has a surgical history of hernia repair and leg surgery. Patient was seen here yesterday by Dr. Eber Hong for the same. He was discharged in stable condition after tests and labs showed no evidence of an acute abdominal condition. Patient doesn't drink alcohol regularly. He is a current every day smoker.  History reviewed. No pertinent past medical history.  Past Surgical History    Procedure Date  . Hernia repair   . Leg surgery     No family history on file.  History  Substance Use Topics  . Smoking status: Current Every Day Smoker  . Smokeless tobacco: Not on file  . Alcohol Use: Yes     occ      Review of Systems  Constitutional: Negative for fatigue.  HENT: Negative for congestion, sinus pressure and ear discharge.   Eyes: Negative for discharge.  Respiratory: Negative for cough.   Cardiovascular: Negative for chest pain.  Gastrointestinal: Positive for nausea, vomiting and abdominal pain. Negative for diarrhea.  Genitourinary: Negative for frequency and hematuria.  Musculoskeletal: Negative for back pain.  Skin: Negative for rash.  Neurological: Negative for seizures and headaches.  Hematological: Negative.   Psychiatric/Behavioral: Negative for hallucinations.    Allergies  Coconut flavor; Penicillins; Tomato; and Latex  Home Medications  No current outpatient prescriptions on file.  BP 112/71  Pulse 72  Temp 98.6 F (37 C) (Oral)  Resp 18  Ht 5' 11.5" (1.816 m)  Wt 160 lb (72.576 kg)  BMI 22.00 kg/m2  SpO2 100%  Physical Exam  Constitutional: He is oriented to person, place, and time. He appears well-developed.  HENT:  Head: Normocephalic and atraumatic.  Eyes: Conjunctivae normal and EOM are normal. No scleral icterus.  Neck: Neck supple. No thyromegaly present.  Cardiovascular: Normal rate and regular rhythm.  Exam reveals no gallop and no friction rub.   No murmur heard. Pulmonary/Chest: No stridor. He has no wheezes. He has no rales. He exhibits no tenderness.  Abdominal: He exhibits no distension. There is no tenderness. There is no rebound.  Musculoskeletal: Normal range of motion. He exhibits no edema.  Lymphadenopathy:    He has no cervical adenopathy.  Neurological: He is oriented to person, place, and time. Coordination normal.  Skin: No rash noted. No erythema.  Psychiatric: He has a normal mood and affect. His  behavior is normal.    ED Course  Procedures (including critical care time) DIAGNOSTIC STUDIES: Oxygen Saturation is 100% on room air, normal by my interpretation.    COORDINATION OF CARE: 11:16am- Patient informed of current plan for treatment and evaluation and agrees with plan at this time.  Results for orders placed during the hospital encounter of 10/03/11  CBC WITH DIFFERENTIAL      Component Value Range   WBC 6.3  4.0 - 10.5 K/uL   RBC 4.53  4.22 - 5.81 MIL/uL   Hemoglobin 13.2  13.0 - 17.0 g/dL   HCT 28.4 (*) 13.2 - 44.0 %   MCV 85.4  78.0 - 100.0 fL   MCH 29.1  26.0 - 34.0 pg   MCHC 34.1  30.0 - 36.0 g/dL   RDW 10.2  72.5 - 36.6 %   Platelets 136 (*) 150 - 400 K/uL   Neutrophils Relative 58  43 - 77 %   Neutro Abs 3.7  1.7 - 7.7 K/uL   Lymphocytes Relative 27  12 - 46 %   Lymphs Abs 1.7  0.7 - 4.0 K/uL   Monocytes Relative 6  3 - 12 %   Monocytes Absolute 0.4  0.1 - 1.0 K/uL   Eosinophils Relative 7 (*) 0 - 5 %   Eosinophils Absolute 0.5  0.0 - 0.7 K/uL   Basophils Relative 1  0 - 1 %   Basophils Absolute 0.0  0.0 - 0.1 K/uL  COMPREHENSIVE METABOLIC PANEL      Component Value Range   Sodium 139  135 - 145 mEq/L   Potassium 4.0  3.5 - 5.1 mEq/L   Chloride 105  96 - 112 mEq/L   CO2 28  19 - 32 mEq/L   Glucose, Bld 92  70 - 99 mg/dL   BUN 11  6 - 23 mg/dL   Creatinine, Ser 4.40  0.50 - 1.35 mg/dL   Calcium 9.5  8.4 - 34.7 mg/dL   Total Protein 6.8  6.0 - 8.3 g/dL   Albumin 3.8  3.5 - 5.2 g/dL   AST 15  0 - 37 U/L   ALT 20  0 - 53 U/L   Alkaline Phosphatase 63  39 - 117 U/L   Total Bilirubin 0.4  0.3 - 1.2 mg/dL   GFR calc non Af Amer >90  >90 mL/min   GFR calc Af Amer >90  >90 mL/min  LIPASE, BLOOD      Component Value Range   Lipase 24  11 - 59 U/L    No results found.   No diagnosis found.    MDM   Peptic ulcers     The chart was scribed for me under my direct supervision.  I personally performed the history, physical, and medical decision  making and all procedures in the evaluation of this patient.[name], MD 10/03/11 1356

## 2011-10-03 NOTE — ED Notes (Signed)
Patient with c/o diffuse abdominal pain x 2 days. Reports recent use of ibuprofen for dental pain. Seen yesterday for same.

## 2011-10-13 ENCOUNTER — Emergency Department (HOSPITAL_COMMUNITY)
Admission: EM | Admit: 2011-10-13 | Discharge: 2011-10-13 | Disposition: A | Discharge status: Home / Self Care | Payer: Self-pay | Attending: Emergency Medicine | Admitting: Emergency Medicine

## 2011-10-13 ENCOUNTER — Emergency Department (HOSPITAL_COMMUNITY): Payer: Self-pay

## 2011-10-13 ENCOUNTER — Encounter (HOSPITAL_COMMUNITY): Payer: Self-pay | Admitting: *Deleted

## 2011-10-13 DIAGNOSIS — K089 Disorder of teeth and supporting structures, unspecified: Secondary | ICD-10-CM | POA: Insufficient documentation

## 2011-10-13 DIAGNOSIS — J40 Bronchitis, not specified as acute or chronic: Secondary | ICD-10-CM | POA: Insufficient documentation

## 2011-10-13 LAB — RAPID STREP SCREEN (MED CTR MEBANE ONLY): Streptococcus, Group A Screen (Direct): NEGATIVE

## 2011-10-13 MED ORDER — PROMETHAZINE-CODEINE 6.25-10 MG/5ML PO SYRP: 5.0000 mL | ORAL_SOLUTION | ORAL | Status: AC | PRN

## 2011-10-13 MED ORDER — PROMETHAZINE-CODEINE 6.25-10 MG/5ML PO SYRP
5.0000 mL | ORAL_SOLUTION | Freq: Once | ORAL | Status: AC
  Administered 2011-10-13: 5 mL via ORAL
  Filled 2011-10-13: qty 5

## 2011-10-13 NOTE — ED Notes (Addendum)
Pt c/o sore throat, cough, congestion, and it hurts to cough x 1 day. Pt also c/o a bad tooth om lower right jaw

## 2011-10-14 NOTE — ED Provider Notes (Signed)
Medical screening examination/treatment/procedure(s) were performed by non-physician practitioner and as supervising physician I was immediately available for consultation/collaboration.   Mildred Bollard L Jurnie Garritano, MD 10/14/11 1412 

## 2011-10-14 NOTE — ED Provider Notes (Signed)
History     CSN: [id number]  Arrival date & time 10/13/11  4098   First MD Initiated Contact with Patient 10/13/11 2143      Chief Complaint  Patient presents with  . Sore Throat  . Nasal Congestion  . Chest Pain  . Dental Pain    (Consider location/radiation/quality/duration/timing/severity/associated sxs/prior treatment) HPI Comments: Cameron Wood presents with complaint of sore throat, nonproductive cough,   Burning midsternal chest pain which is triggered by coughing,  And resolved when not coughing, nasal congestion and post nasal drip.  He also reports generalized fatigue and fevers and chills.  He also reports having a bad tooth which causes chronic pain.  He denies any current swelling or drainage from around the tooth and did have a course of antibiotics 2 months ago for a dental infection.  He has not sought dental care yet for this tooth.  The history is provided by the patient.    History reviewed. No pertinent past medical history.  Past Surgical History  Procedure Date  . Hernia repair   . Leg surgery     History reviewed. No pertinent family history.  History  Substance Use Topics  . Smoking status: Current Every Day Smoker  . Smokeless tobacco: Not on file  . Alcohol Use: Yes     occ      Review of Systems  Constitutional: Positive for fever.  HENT: Positive for congestion, sore throat, rhinorrhea, dental problem and postnasal drip. Negative for ear pain, trouble swallowing, neck pain and voice change.   Eyes: Negative.   Respiratory: Positive for cough. Negative for chest tightness and shortness of breath.   Cardiovascular: Positive for chest pain.  Gastrointestinal: Negative for nausea and abdominal pain.  Genitourinary: Negative.   Musculoskeletal: Negative for joint swelling and arthralgias.  Skin: Negative.  Negative for rash and wound.  Neurological: Negative for dizziness, weakness, light-headedness, numbness and headaches.    Hematological: Negative.   Psychiatric/Behavioral: Negative.     Allergies  Coconut flavor; Penicillins; Tomato; and Latex  Home Medications   Current Outpatient Rx  Name Route Sig Dispense Refill  . OXYCODONE-ACETAMINOPHEN 5-325 MG PO TABS Oral Take 1 tablet by mouth every 6 (six) hours as needed for pain. 30 tablet 0  . PROMETHAZINE-CODEINE 6.25-10 MG/5ML PO SYRP Oral Take 5 mLs by mouth every 4 (four) hours as needed for cough. 120 mL 0  . RANITIDINE HCL 150 MG PO CAPS Oral Take 1 capsule (150 mg total) by mouth 2 (two) times daily. 60 capsule 0    BP 135/78  Pulse 87  Temp 98.8 F (37.1 C) (Oral)  Resp 20  Ht 5' 11.5" (1.816 m)  Wt 160 lb (72.576 kg)  BMI 22.00 kg/m2  SpO2 98%  Physical Exam  Constitutional: He is oriented to person, place, and time. He appears well-developed and well-nourished.  HENT:  Head: Normocephalic and atraumatic.  Right Ear: Tympanic membrane and ear canal normal.  Left Ear: Tympanic membrane and ear canal normal.  Nose: Mucosal edema and rhinorrhea present. Right sinus exhibits no maxillary sinus tenderness and no frontal sinus tenderness. Left sinus exhibits no maxillary sinus tenderness and no frontal sinus tenderness.  Mouth/Throat: Uvula is midline and mucous membranes are normal. Posterior oropharyngeal erythema present. No oropharyngeal exudate, posterior oropharyngeal edema or tonsillar abscesses.    Eyes: Conjunctivae normal are normal.  Pulmonary/Chest: Effort normal. No respiratory distress. He has no wheezes. He has no rales.  Abdominal: Soft. There is no  tenderness.  Musculoskeletal: Normal range of motion.  Neurological: He is alert and oriented to person, place, and time.  Skin: Skin is warm and dry. No rash noted.  Psychiatric: He has a normal mood and affect.    ED Course  Procedures (including critical care time)   Labs Reviewed  RAPID STREP SCREEN  LAB REPORT - SCANNED   Dg Chest 2 View  10/13/2011  *RADIOLOGY  REPORT*  Clinical Data: Sore throat and nasal congestion with chest pain. Cough 1 week.  Smoker.  CHEST - 2 VIEW  Comparison: 04/02/2010 acute abdomen series.  Findings:  The heart size and mediastinal contours are within normal limits.  Both lungs are clear.  The visualized skeletal structures are unremarkable. No change from priors except for slight decreased lung volumes.  IMPRESSION: No active cardiopulmonary disease.   Original Report Authenticated By: Elsie Stain, M.D.      1. Bronchitis       MDM  Pt enoucouraged rest,  Increased fluid intake,  Motrin or tylenol for fever and pain relief.  Prescribed promethazine/codeine cough syrup.  Referral for primary care given.  The patient appears reasonably screened and/or stabilized for discharge and I doubt any other medical condition or other [facility] requiring further screening, evaluation, or treatment in the ED at this time prior to discharge.         Burgess Amor, PA 10/14/11 1407

## 2012-06-25 ENCOUNTER — Encounter (HOSPITAL_COMMUNITY): Payer: Self-pay | Admitting: Emergency Medicine

## 2012-06-25 ENCOUNTER — Emergency Department (HOSPITAL_COMMUNITY)
Admission: EM | Admit: 2012-06-25 | Discharge: 2012-06-25 | Disposition: A | Discharge status: Home / Self Care | Payer: Medicaid Other | Attending: Emergency Medicine | Admitting: Emergency Medicine

## 2012-06-25 ENCOUNTER — Emergency Department (HOSPITAL_COMMUNITY): Payer: Medicaid Other

## 2012-06-25 DIAGNOSIS — R05 Cough: Secondary | ICD-10-CM | POA: Insufficient documentation

## 2012-06-25 DIAGNOSIS — R6883 Chills (without fever): Secondary | ICD-10-CM | POA: Insufficient documentation

## 2012-06-25 DIAGNOSIS — Z880 Allergy status to penicillin: Secondary | ICD-10-CM | POA: Insufficient documentation

## 2012-06-25 DIAGNOSIS — Z91040 Latex allergy status: Secondary | ICD-10-CM | POA: Insufficient documentation

## 2012-06-25 DIAGNOSIS — Z87448 Personal history of other diseases of urinary system: Secondary | ICD-10-CM | POA: Insufficient documentation

## 2012-06-25 DIAGNOSIS — Z79899 Other long term (current) drug therapy: Secondary | ICD-10-CM | POA: Insufficient documentation

## 2012-06-25 DIAGNOSIS — Z85528 Personal history of other malignant neoplasm of kidney: Secondary | ICD-10-CM | POA: Insufficient documentation

## 2012-06-25 DIAGNOSIS — J159 Unspecified bacterial pneumonia: Secondary | ICD-10-CM | POA: Insufficient documentation

## 2012-06-25 DIAGNOSIS — R0602 Shortness of breath: Secondary | ICD-10-CM | POA: Insufficient documentation

## 2012-06-25 DIAGNOSIS — F17200 Nicotine dependence, unspecified, uncomplicated: Secondary | ICD-10-CM | POA: Insufficient documentation

## 2012-06-25 DIAGNOSIS — R059 Cough, unspecified: Secondary | ICD-10-CM | POA: Insufficient documentation

## 2012-06-25 DIAGNOSIS — J189 Pneumonia, unspecified organism: Secondary | ICD-10-CM

## 2012-06-25 MED ORDER — AZITHROMYCIN 250 MG PO TABS
500.0000 mg | ORAL_TABLET | Freq: Once | ORAL | Status: AC
  Administered 2012-06-25: 500 mg via ORAL
  Filled 2012-06-25: qty 2

## 2012-06-25 MED ORDER — AZITHROMYCIN 250 MG PO TABS: 250.0000 mg | ORAL_TABLET | Freq: Every day | ORAL | Status: AC

## 2012-06-25 MED ORDER — IBUPROFEN 400 MG PO TABS
600.0000 mg | ORAL_TABLET | Freq: Once | ORAL | Status: DC
  Filled 2012-06-25: qty 2

## 2012-06-25 MED ORDER — OXYCODONE-ACETAMINOPHEN 5-325 MG PO TABS: 1.0000 | ORAL_TABLET | ORAL | Status: AC | PRN

## 2012-06-25 MED ORDER — OXYCODONE-ACETAMINOPHEN 5-325 MG PO TABS
2.0000 | ORAL_TABLET | Freq: Once | ORAL | Status: AC
  Administered 2012-06-25: 2 via ORAL
  Filled 2012-06-25: qty 2

## 2012-06-25 NOTE — ED Notes (Signed)
Pt c/o waking up at 0730 with sharp right rib pain-states pain is worse with deep breath/coughing. Denies injury.

## 2012-06-25 NOTE — ED Provider Notes (Signed)
History  This chart was scribed for Cameron Razor, MD by Ardelia Mems, ED Scribe. This patient was seen in room APA03/APA03 and the patient's care was started at 4:49 PM.   CSN: [id number]  Arrival date & time 06/25/12  1548     Chief Complaint  Patient presents with  . Chest Pain     HPI  HPI Comments: Cameron Wood is a 32 y.o. male who presents to the Emergency Department complaining of gradually worsening, constant, moderate, sharp, right-sided rib pain onset 10 hours ago. Pt states that he was fine last night. Pt states that his rib pain is worsened with deep inspiration, swallowing and coughing. Pt reports associated SOB secondary to pain with deep inspiration. Pt reports associated chills and coughing. Pt denies h/o blood clots. Pt denies leg swelling, fever, vomiting, diarrhea or any other symptoms. Pt is a current every day smoker and an occasional alcohol user.   Past Medical History  Diagnosis Date  . Renal disorder   . Cancer     Kidney     Past Surgical History  Procedure Laterality Date  . Hernia repair    . Leg surgery      No family history on file.  History  Substance Use Topics  . Smoking status: Current Every Day Smoker  . Smokeless tobacco: Not on file  . Alcohol Use: Yes     Comment: occ      Review of Systems  Constitutional: Positive for chills. Negative for fever.  Respiratory: Positive for cough and shortness of breath.   Cardiovascular: Negative for leg swelling.       Rib pain.  Gastrointestinal: Negative for nausea, vomiting and diarrhea.   A complete 10 system review of systems was obtained and all systems are negative except as noted in the HPI and PMH.   Allergies  Coconut flavor; Penicillins; Tomato; and Latex  Home Medications   Current Outpatient Rx  Name  Route  Sig  Dispense  Refill  . promethazine-codeine (PHENERGAN WITH CODEINE) 6.25-10 MG/5ML syrup   Oral   Take 5 mLs by mouth every 4 (four) hours as needed for  cough.   120 mL   0   . ranitidine (ZANTAC) 150 MG capsule   Oral   Take 1 capsule (150 mg total) by mouth 2 (two) times daily.   60 capsule   0     Triage Vitals: BP 120/92  Pulse 92  Temp(Src) 97.1 F (36.2 C) (Oral)  Resp 18  Ht 6' (1.829 m)  Wt 165 lb (74.844 kg)  BMI 22.37 kg/m2  SpO2 96%  Physical Exam  Nursing note and vitals reviewed. Constitutional: He is oriented to person, place, and time. He appears well-developed and well-nourished.  HENT:  Head: Normocephalic and atraumatic.  Eyes: EOM are normal.  Neck: Normal range of motion.  Cardiovascular: Normal rate, regular rhythm, normal heart sounds and intact distal pulses.   Pulmonary/Chest: Effort normal and breath sounds normal. No respiratory distress.  Right anterior chest wall tenderness.  Abdominal: Soft. He exhibits no distension. There is no tenderness.  Genitourinary: Rectum normal.  Musculoskeletal: Normal range of motion.  Neurological: He is alert and oriented to person, place, and time.  Skin: Skin is warm and dry.  Psychiatric: He has a normal mood and affect. Judgment normal.    ED Course  Procedures (including critical care time)  DIAGNOSTIC STUDIES: Oxygen Saturation is 96% on RA, adequate by my interpretation.    COORDINATION  OF CARE: 4:55 PM- Pt advised of plan for treatment and pt agrees.   Medications  ibuprofen (ADVIL,MOTRIN) tablet 600 mg (600 mg Oral Not Given 06/25/12 1727)  azithromycin (ZITHROMAX) tablet 500 mg (500 mg Oral Given 06/25/12 1727)  oxyCODONE-acetaminophen (PERCOCET/ROXICET) 5-325 MG per tablet 2 tablet (2 tablets Oral Given 06/25/12 1726)     Labs Reviewed - No data to display Dg Ribs Unilateral W/chest Right  06/25/2012   *RADIOLOGY REPORT*  Clinical Data: Chest pain, right rib pain, no injury  RIGHT RIBS AND CHEST - 3+ VIEW  Comparison: Chest radiograph 10/13/2011  Findings: Normal cardiac silhouette.  There is fine air space disease in the right lower lobe in  a reticular nodular pattern.  No fracture of the right ribs.  No pneumothorax.  No pleural fluid  IMPRESSION: Concern for early right lower lobe pneumonia.  No rib abnormality.   Original Report Authenticated By: Cameron Wood, M.D.     1. CAP (community acquired pneumonia)       MDM  32yM with R sided CP and cough. CXR consistent with pneumonia. I feel appropriate for outpt tx at this time.      I personally preformed the services scribed in my presence. The recorded information has been reviewed is accurate. Cameron Razor, MD.    Cameron Razor, MD 07/01/12 [phone]

## 2013-12-02 ENCOUNTER — Encounter (HOSPITAL_COMMUNITY): Payer: Self-pay | Admitting: Emergency Medicine

## 2013-12-02 ENCOUNTER — Emergency Department (HOSPITAL_COMMUNITY)
Admission: EM | Admit: 2013-12-02 | Discharge: 2013-12-02 | Disposition: A | Discharge status: Home / Self Care | Payer: Medicaid Other | Attending: Emergency Medicine | Admitting: Emergency Medicine

## 2013-12-02 DIAGNOSIS — Z79899 Other long term (current) drug therapy: Secondary | ICD-10-CM | POA: Insufficient documentation

## 2013-12-02 DIAGNOSIS — R21 Rash and other nonspecific skin eruption: Secondary | ICD-10-CM | POA: Diagnosis present

## 2013-12-02 DIAGNOSIS — Z880 Allergy status to penicillin: Secondary | ICD-10-CM | POA: Insufficient documentation

## 2013-12-02 DIAGNOSIS — Z85528 Personal history of other malignant neoplasm of kidney: Secondary | ICD-10-CM | POA: Insufficient documentation

## 2013-12-02 DIAGNOSIS — Z87448 Personal history of other diseases of urinary system: Secondary | ICD-10-CM | POA: Diagnosis not present

## 2013-12-02 DIAGNOSIS — Z720 Tobacco use: Secondary | ICD-10-CM | POA: Insufficient documentation

## 2013-12-02 DIAGNOSIS — Z792 Long term (current) use of antibiotics: Secondary | ICD-10-CM | POA: Diagnosis not present

## 2013-12-02 DIAGNOSIS — Z91040 Latex allergy status: Secondary | ICD-10-CM | POA: Diagnosis not present

## 2013-12-02 MED ORDER — PERMETHRIN 5 % EX CREA: TOPICAL_CREAM | CUTANEOUS | Status: AC

## 2013-12-02 NOTE — ED Notes (Signed)
Patient c/o rash to upper torso and upper extremities. C/o itching.

## 2013-12-02 NOTE — ED Provider Notes (Signed)
CSN: [id number]     Arrival date & time 12/02/13  0530 History   First MD Initiated Contact with Patient 12/02/13 0549     No chief complaint on file.    HPI Patient presents with rash to his arms legs chest abdomen and backthat itches over the past several weeks.  His partner who he shares a bed with has several small lesions.  They do have 3 dogs.  No recent new furniture mattresses in the house.  Patient has not tried any medication.  Patient reports that he just continues to itch at this time.  No fevers or chills.  No spreading redness.   Past Medical History  Diagnosis Date  . Renal disorder   . Cancer     Kidney    Past Surgical History  Procedure Laterality Date  . Hernia repair    . Leg surgery     No family history on file. History  Substance Use Topics  . Smoking status: Current Every Day Smoker  . Smokeless tobacco: Not on file  . Alcohol Use: Yes     Comment: occ    Review of Systems  All other systems reviewed and are negative.     Allergies  Coconut flavor; Penicillins; Tomato; and Latex  Home Medications   Prior to Admission medications   Medication Sig Start Date End Date Taking? Authorizing Provider  azithromycin (ZITHROMAX) 250 MG tablet Take 1 tablet (250 mg total) by mouth daily. Take first 2 tablets together, then 1 every day until finished. 06/25/12   Raeford RazorStephen Kohut, MD  oxyCODONE-acetaminophen (PERCOCET/ROXICET) 5-325 MG per tablet Take 1 tablet by mouth every 4 (four) hours as needed for pain. 06/25/12   Raeford RazorStephen Kohut, MD  permethrin (ACTICIN) 5 % cream Thoroughly wash and dry skin. Massage the cream into the skin from the head to the soles of the feet, paying special attention to creases in the skin, hands, feet, between fingers and toes, underarms, and groin. Leave the permethrin cream on the skin for 8 to 14 hours. Wash off by taking a shower or bath. Change into clean clothes. After treatment, itching may continue for up to 4 weeks.  12/02/13   Lyanne CoKevin M Jashon Ishida, MD  promethazine-codeine [place](PHENERGAN WITH CODEINE) 6.25-10 MG/5ML syrup Take 5 mLs by mouth every 4 (four) hours as needed for cough. 10/13/11   Burgess AmorJulie Idol, PA-C  ranitidine (ZANTAC) 150 MG capsule Take 1 capsule (150 mg total) by mouth 2 (two) times daily. 10/03/11   Benny LennertJoseph L Zammit, MD   BP 128/83 mmHg  Pulse 84  Temp(Src) 98.4 F (36.9 C) (Oral)  Resp 18  SpO2 97% Physical Exam  Constitutional: He is oriented to person, place, and time. He appears well-developed and well-nourished.  HENT:  Head: Normocephalic.  Eyes: EOM are normal.  Neck: Normal range of motion.  Pulmonary/Chest: Effort normal.  Abdominal: He exhibits no distension.  Musculoskeletal: Normal range of motion.  Neurological: He is alert and oriented to person, place, and time.  Skin:  Multiple small noninfected appearing lesions to the arms legs chest abdomen and groin regions.  These appear consistent with bug bites  Psychiatric: He has a normal mood and affect.  Nursing note and vitals reviewed.   ED Course  Procedures (including critical care time) Labs Review Labs Reviewed - No data to display  Imaging Review No results found.   EKG Interpretation None      MDM   Final diagnoses:  Rash    Likely  represents scabies versus bedbugs versus fleas.  Patient will be given permethrin.  I recommended that they call an exterminator to the house    Lyanne CoKevin M Remmi Armenteros, MD 12/02/13 [phone]

## 2013-12-02 NOTE — Discharge Instructions (Signed)
Thoroughly wash and dry skin. Massage the cream into the skin from the head to the soles of the feet, paying special attention to creases in the skin, hands, feet, between fingers and toes, underarms, and groin. Leave the permethrin cream on the skin for 8 to 14 hours. Wash off by taking a shower or bath. Change into clean clothes. After treatment, itching may continue for up to 4 weeks.    Scabies Scabies are small bugs (mites) that burrow under the skin and cause red bumps and severe itching. These bugs can only be seen with a microscope. Scabies are highly contagious. They can spread easily from person to person by direct contact. They are also spread through sharing clothing or linens that have the scabies mites living in them. It is not unusual for an entire family to become infected through shared towels, clothing, or bedding.  HOME CARE INSTRUCTIONS   Your caregiver may prescribe a cream or lotion to kill the mites. If cream is prescribed, massage the cream into the entire body from the neck to the bottom of both feet. Also massage the cream into the scalp and face if your child is less than 32 year old. Avoid the eyes and mouth. Do not wash your hands after application.  Leave the cream on for 8 to 12 hours. Your child should bathe or shower after the 8 to 12 hour application period. Sometimes it is helpful to apply the cream to your child right before bedtime.  One treatment is usually effective and will eliminate approximately 95% of infestations. For severe cases, your caregiver may decide to repeat the treatment in 1 week. Everyone in your household should be treated with one application of the cream.  New rashes or burrows should not appear within 24 to 48 hours after successful treatment. However, the itching and rash may last for 2 to 4 weeks after successful treatment. Your caregiver may prescribe a medicine to help with the itching or to help the rash go away more  quickly.  Scabies can live on clothing or linens for up to 3 days. All of your child's recently used clothing, towels, stuffed toys, and bed linens should be washed in hot water and then dried in a dryer for at least 20 minutes on high heat. Items that cannot be washed should be enclosed in a plastic bag for at least 3 days.  To help relieve itching, bathe your child in a cool bath or apply cool washcloths to the affected areas.  Your child may return to school after treatment with the prescribed cream. SEEK MEDICAL CARE IF:   The itching persists longer than 4 weeks after treatment.  The rash spreads or becomes infected. Signs of infection include red blisters or yellow-tan crust. Document Released: 01/01/2005 Document Revised: 03/26/2011 Document Reviewed: 05/12/2008 [facility] Patient Information 2015 [employer], [place]. This information is not intended to replace advice given to you by your health care provider. Make sure you discuss any questions you have with your health care provider.

## 2014-04-18 ENCOUNTER — Emergency Department (HOSPITAL_COMMUNITY)
Admission: EM | Admit: 2014-04-18 | Discharge: 2014-04-19 | Disposition: A | Discharge status: Home / Self Care | Payer: Medicare HMO | Attending: Emergency Medicine | Admitting: Emergency Medicine

## 2014-04-18 ENCOUNTER — Encounter (HOSPITAL_COMMUNITY): Payer: Self-pay

## 2014-04-18 DIAGNOSIS — R55 Syncope and collapse: Secondary | ICD-10-CM | POA: Diagnosis not present

## 2014-04-18 DIAGNOSIS — Z79899 Other long term (current) drug therapy: Secondary | ICD-10-CM | POA: Diagnosis not present

## 2014-04-18 DIAGNOSIS — Z880 Allergy status to penicillin: Secondary | ICD-10-CM | POA: Insufficient documentation

## 2014-04-18 DIAGNOSIS — Z91040 Latex allergy status: Secondary | ICD-10-CM | POA: Diagnosis not present

## 2014-04-18 DIAGNOSIS — Z7982 Long term (current) use of aspirin: Secondary | ICD-10-CM | POA: Diagnosis not present

## 2014-04-18 DIAGNOSIS — Z720 Tobacco use: Secondary | ICD-10-CM | POA: Insufficient documentation

## 2014-04-18 DIAGNOSIS — Z87448 Personal history of other diseases of urinary system: Secondary | ICD-10-CM | POA: Insufficient documentation

## 2014-04-18 DIAGNOSIS — Z87442 Personal history of urinary calculi: Secondary | ICD-10-CM | POA: Diagnosis not present

## 2014-04-18 LAB — CBG MONITORING, ED: Glucose-Capillary: 89 mg/dL (ref 70–99)

## 2014-04-18 LAB — CBC
HEMATOCRIT: 43.3 % (ref 39.0–52.0)
HEMOGLOBIN: 14.7 g/dL (ref 13.0–17.0)
MCH: 28.6 pg (ref 26.0–34.0)
MCHC: 33.9 g/dL (ref 30.0–36.0)
MCV: 84.2 fL (ref 78.0–100.0)
Platelets: 163 10*3/uL (ref 150–400)
RBC: 5.14 MIL/uL (ref 4.22–5.81)
RDW: 13.7 % (ref 11.5–15.5)
WBC: 7.5 10*3/uL (ref 4.0–10.5)

## 2014-04-18 NOTE — ED Notes (Signed)
Patient via EMS stating syncopal episode. Patient states prior to LOC patient began to have shortness of breath. Patient is A&OX4 at this time.

## 2014-04-19 DIAGNOSIS — R55 Syncope and collapse: Secondary | ICD-10-CM | POA: Diagnosis not present

## 2014-04-19 LAB — COMPREHENSIVE METABOLIC PANEL
ALK PHOS: 81 U/L (ref 39–117)
ALT: 28 U/L (ref 0–53)
AST: 22 U/L (ref 0–37)
Albumin: 4.5 g/dL (ref 3.5–5.2)
Anion gap: 8 (ref 5–15)
BUN: 13 mg/dL (ref 6–23)
CALCIUM: 9.6 mg/dL (ref 8.4–10.5)
CO2: 27 mmol/L (ref 19–32)
Chloride: 106 mmol/L (ref 96–112)
Creatinine, Ser: 1.1 mg/dL (ref 0.50–1.35)
GFR calc Af Amer: >90 mL/min (ref >90)
GFR, EST NON AFRICAN AMERICAN: 87 mL/min — AB (ref >90)
GLUCOSE: 83 mg/dL (ref 70–99)
POTASSIUM: 3.6 mmol/L (ref 3.5–5.1)
SODIUM: 141 mmol/L (ref 135–145)
Total Bilirubin: 0.6 mg/dL (ref 0.3–1.2)
Total Protein: 7.7 g/dL (ref 6.0–8.3)

## 2014-04-19 LAB — URINALYSIS, ROUTINE W REFLEX MICROSCOPIC
Glucose, UA: NEGATIVE mg/dL
HGB URINE DIPSTICK: NEGATIVE
Leukocytes, UA: NEGATIVE
NITRITE: NEGATIVE
PROTEIN: NEGATIVE mg/dL
Specific Gravity, Urine: 1.025 (ref 1.005–1.030)
UROBILINOGEN UA: 1 mg/dL (ref 0.0–1.0)
pH: 6.5 (ref 5.0–8.0)

## 2014-04-19 LAB — RAPID URINE DRUG SCREEN, HOSP PERFORMED
AMPHETAMINES: NOT DETECTED
Barbiturates: NOT DETECTED
Benzodiazepines: NOT DETECTED
Cocaine: POSITIVE — AB
Opiates: NOT DETECTED
Tetrahydrocannabinol: POSITIVE — AB

## 2014-04-19 MED ORDER — SODIUM CHLORIDE 0.9 % IV SOLN
1000.0000 mL | Freq: Once | INTRAVENOUS | Status: AC
  Administered 2014-04-19: 1000 mL via INTRAVENOUS

## 2014-04-19 MED ORDER — SODIUM CHLORIDE 0.9 % IV SOLN
1000.0000 mL | INTRAVENOUS | Status: DC
  Administered 2014-04-19: 1000 mL via INTRAVENOUS

## 2014-04-19 NOTE — ED Provider Notes (Signed)
CSN: [id number]     Arrival date & time 04/18/14  2326 History   First MD Initiated Contact with Patient 04/18/14 2352     Chief Complaint  Patient presents with  . Loss of Consciousness     (Consider location/radiation/quality/duration/timing/severity/associated sxs/prior Treatment) HPI  Patient reports he has stage IV colon cancer and stage II kidney cancer and IBS. He states he has been having syncopal episodes since he was diagnosed with cancer in 2007. He states they normally happen when he has pain. He states about noon today he started having pain from his groin up to his throat and from his lower back up his back into his neck. The pain is described as sharp and stabbing and constant. He denies nausea or vomiting. He has his usual chronic diarrhea. He has had chills today without coughing. He denies any injury from his syncopal episode today. States he still feels dizzy and lightheaded. He states he has not told his doctors about the passing out spells. He states he passed out at least 10 times in March. Each time was with pain.   Patient states he has never received any treatment for his cancer, because he refuses. He does not recall the name of his cancer doctor but states the doctor is from [place] and he meets him in the parking lot at his PCPs office and the cancer doctor tells him what state his cancer is in. He also draws his blood in the parking lot. Patient states he had his last staging CT scan done  last month through his primary care doctor's office.   PCP Dr Olena LeatherwoodHasanaj in [place]  Past Medical History  Diagnosis Date  . Renal disorder   . Cancer     Kidney    Past Surgical History  Procedure Laterality Date  . Hernia repair    . Leg surgery     History reviewed. No pertinent family history. History  Substance Use Topics  . Smoking status: Current Every Day Smoker  . Smokeless tobacco: Not on file  . Alcohol Use: Yes     Comment: occ   on disability for learning  disability and ADHD Smokes 1/2-1 pack a day Smokes half a gram of marijuana daily Denies alcohol to me  Review of Systems  All other systems reviewed and are negative.     Allergies  Coconut flavor; Penicillins; Tomato; Latex; Morphine and related; and Tramadol  Home Medications   Prior to Admission medications   Medication Sig Start Date End Date Taking? Authorizing Provider  azithromycin (ZITHROMAX) 250 MG tablet Take 1 tablet (250 mg total) by mouth daily. Take first 2 tablets together, then 1 every day until finished. 06/25/12   Raeford RazorStephen Kohut, MD  oxyCODONE-acetaminophen (PERCOCET/ROXICET) 5-325 MG per tablet Take 1 tablet by mouth every 4 (four) hours as needed for pain. 06/25/12   Raeford RazorStephen Kohut, MD  permethrin (ACTICIN) 5 % cream Thoroughly wash and dry skin. Massage the cream into the skin from the head to the soles of the feet, paying special attention to creases in the skin, hands, feet, between fingers and toes, underarms, and groin. Leave the permethrin cream on the skin for 8 to 14 hours. Wash off by taking a shower or bath. Change into clean clothes. After treatment, itching may continue for up to 4 weeks. 12/02/13   Azalia BilisKevin Campos, MD  promethazine-codeine [facility](PHENERGAN WITH CODEINE) 6.25-10 MG/5ML syrup Take 5 mLs by mouth every 4 (four) hours as needed for cough. 10/13/11  Burgess Amor, PA-C  ranitidine (ZANTAC) 150 MG capsule Take 1 capsule (150 mg total) by mouth 2 (two) times daily. 10/03/11   Bethann Berkshire, MD   BP 116/85 mmHg  Pulse 85  Temp(Src) 98.4 F (36.9 C) (Oral)  Resp 20  SpO2 98%  Vital signs normal   00:59 Orthostatic Vital Signs AD  Orthostatic Lying  - BP- Lying: 117/82 mmHg ; Pulse- Lying: 66  Orthostatic Sitting - BP- Sitting: 124/86 mmHg ; Pulse- Sitting: 99  Orthostatic Standing at 0 minutes - BP- Standing at 0 minutes: 120/104 mmHg ; Pulse- Standing at 0 minutes: 99     Orthostatics are positive with pulses  Physical Exam  Constitutional: He  is oriented to person, place, and time. He appears well-developed and well-nourished.  Non-toxic appearance. He does not appear ill. No distress.  HENT:  Head: Normocephalic and atraumatic.  Right Ear: External ear normal.  Left Ear: External ear normal.  Nose: Nose normal. No mucosal edema or rhinorrhea.  Mouth/Throat: Oropharynx is clear and moist and mucous membranes are normal. No dental abscesses or uvula swelling.  Eyes: Conjunctivae and EOM are normal. Pupils are equal, round, and reactive to light.  Neck: Normal range of motion and full passive range of motion without pain. Neck supple.  Cardiovascular: Normal rate, regular rhythm and normal heart sounds.  Exam reveals no gallop and no friction rub.   No murmur heard. Pulmonary/Chest: Effort normal and breath sounds normal. No respiratory distress. He has no wheezes. He has no rhonchi. He has no rales. He exhibits no tenderness and no crepitus.  Abdominal: Soft. Normal appearance and bowel sounds are normal. He exhibits no distension. There is no tenderness. There is no rebound and no guarding.  Musculoskeletal: Normal range of motion. He exhibits no edema or tenderness.  Moves all extremities well.   Neurological: He is alert and oriented to person, place, and time. He has normal strength. No cranial nerve deficit.  Skin: Skin is warm, dry and intact. No rash noted. No erythema. No pallor.  Psychiatric: He has a normal mood and affect. His speech is normal and behavior is normal. His mood appears not anxious.  Nursing note and vitals reviewed.   ED Course  Procedures (including critical care time)  Medications  0.9 %  sodium chloride infusion (0 mLs Intravenous Stopped 04/19/14 0550)    Followed by  0.9 %  sodium chloride infusion (0 mLs Intravenous Stopped 04/19/14 0636)    Followed by  0.9 %  sodium chloride infusion (0 mLs Intravenous Stopped 04/19/14 0636)   Pt slept during his ED visit. When I shook his foot to wake him up he  said he was still feeling dizzy like he is going to pass out, denies spinning.   Repeat orthostatics done at discharge  06:32 Orthostatic Vital Signs AD  Orthostatic Lying  - BP- Lying: 105/73 mmHg ; Pulse- Lying: 74  Orthostatic Sitting - BP- Sitting: 119/82 mmHg ; Pulse- Sitting: 85  Orthostatic Standing at 0 minutes - BP- Standing at 0 minutes: 132/94 mmHg ; Pulse- Standing at 0 minutes: 90       Review of the PACs systems shows no scans from 2016 or 2015. He did have a CT scan of his abdomen and pelvis in 2013 which was normal.  Review of the [place] shows patient got 3 narcotic prescriptions in October from a Careers adviser in [place], Dr. Fraser Din. He received #45 hydrocodone 10/325 twice and #90 tramadol 50  mg tablets once. , Labs Review Results for orders placed or performed during the hospital encounter of 04/18/14  CBC  Result Value Ref Range   WBC 7.5 4.0 - 10.5 K/uL   RBC 5.14 4.22 - 5.81 MIL/uL   Hemoglobin 14.7 13.0 - 17.0 g/dL   HCT 16.1 09.6 - 04.5 %   MCV 84.2 78.0 - 100.0 fL   MCH 28.6 26.0 - 34.0 pg   MCHC 33.9 30.0 - 36.0 g/dL   RDW 40.9 81.1 - 91.4 %   Platelets 163 150 - 400 K/uL  Comprehensive metabolic panel  Result Value Ref Range   Sodium 141 135 - 145 mmol/L   Potassium 3.6 3.5 - 5.1 mmol/L   Chloride 106 96 - 112 mmol/L   CO2 27 19 - 32 mmol/L   Glucose, Bld 83 70 - 99 mg/dL   BUN 13 6 - 23 mg/dL   Creatinine, Ser 7.82 0.50 - 1.35 mg/dL   Calcium 9.6 8.4 - 95.6 mg/dL   Total Protein 7.7 6.0 - 8.3 g/dL   Albumin 4.5 3.5 - 5.2 g/dL   AST 22 0 - 37 U/L   ALT 28 0 - 53 U/L   Alkaline Phosphatase 81 39 - 117 U/L   Total Bilirubin 0.6 0.3 - 1.2 mg/dL   GFR calc non Af Amer 87 (L) >90 mL/min   GFR calc Af Amer >90 >90 mL/min   Anion gap 8 5 - 15  Urinalysis, Routine w reflex microscopic  Result Value Ref Range   Color, Urine YELLOW YELLOW   APPearance CLEAR CLEAR   Specific Gravity, Urine 1.025 1.005 - 1.030   pH 6.5 5.0 - 8.0    Glucose, UA NEGATIVE NEGATIVE mg/dL   Hgb urine dipstick NEGATIVE NEGATIVE   Bilirubin Urine SMALL (A) NEGATIVE   Ketones, ur TRACE (A) NEGATIVE mg/dL   Protein, ur NEGATIVE NEGATIVE mg/dL   Urobilinogen, UA 1.0 0.0 - 1.0 mg/dL   Nitrite NEGATIVE NEGATIVE   Leukocytes, UA NEGATIVE NEGATIVE  Urine rapid drug screen (hosp performed)  Result Value Ref Range   Opiates NONE DETECTED NONE DETECTED   Cocaine POSITIVE (A) NONE DETECTED   Benzodiazepines NONE DETECTED NONE DETECTED   Amphetamines NONE DETECTED NONE DETECTED   Tetrahydrocannabinol POSITIVE (A) NONE DETECTED   Barbiturates NONE DETECTED NONE DETECTED  CBG monitoring, ED  Result Value Ref Range   Glucose-Capillary 89 70 - 99 mg/dL   Laboratory interpretation all normal except +UDS     Imaging Review No results found.   September 2013  *RADIOLOGY REPORT*  Clinical Data: Mid abdominal pain, history of hernia repair  CT ABDOMEN AND PELVIS WITH CONTRAST  Technique: Multidetector CT imaging of the abdomen and pelvis was performed following the standard protocol during bolus administration of intravenous contrast.  Contrast: OMNIPAQUE IOHEXOL 300 MG/ML SOLN  Comparison: None.  Findings: Sagittal images of the spine are unremarkable. No destructive bony lesions are noted.  Lung bases are unremarkable. Liver, spleen, pancreas and adrenal glands are unremarkable. No calcified gallstones are noted within gallbladder. Axial image 4 there is a low density lesion in the right hepatic dome measures 5 mm probable a cyst.  Kidneys are symmetrical in size and enhancement. No focal renal mass. No hydronephrosis or hydroureter.  No aortic aneurysm.  No small bowel obstruction. No ascites or free air. No adenopathy.  There is no pericecal inflammation. A short appendix is only partially visualized in the coronal image 25 with normal appearance.  There is no mesenteric mass or adenopathy. No mesenteric  fluid collection. No small bowel air-fluid levels are noted. Distal small bowel is empty collapsed.  The prostate gland and seminal vesicles are unremarkable. Some colonic stool noted. Some gas noted within the rectum. Nonspecific mild thickening of urinary bladder wall. No urinary bladder filling defects are noted. Bilateral distal ureter is unremarkable. Nonspecific bilateral small inguinal lymph nodes are noted. No destructive bony lesions are noted within pelvis. 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  IMPRESSION:  1. No acute inflammatory process within abdomen or pelvis. 2. No small bowel obstruction. No ascites or free air. No adenopathy. 3. No hydronephrosis or hydroureter. 4. No pericecal inflammation. 5. Nonspecific mild thickening of urinary bladder wall.  6. Bilateral pars defect at L5 level.   Original Report Authenticated By: Natasha Mead, M.D.  Embedded Images (not for diagnostic purposes)      Signs and Symptoms:     History: pain    Comments: Mid abd pain, hx hernia repair    Visit Pt Loc: [facility] Phone:       EKG Interpretation   Date/Time:  Sunday April 18 2014 23:47:34 EDT Ventricular Rate:  79 PR Interval:  169 QRS Duration: 85 QT Interval:  363 QTC Calculation: 416 R Axis:   86 Text Interpretation:  Sinus rhythm RSR' in V1 or V2, probably normal  variant ST elev, probable normal early repol pattern No old tracing to  compare Confirmed by Orson Rho  MD-I, Halee Glynn ([id number]) on 04/18/2014 11:57:36 PM      MDM   Final diagnoses:  Syncope, unspecified syncope type    Plan discharge  Devoria Albe, MD, Concha Pyo, MD 04/19/14 [phone]

## 2014-04-19 NOTE — Discharge Instructions (Signed)
You need to talk to Dr Olena LeatherwoodHasanaj about your passing out spells.  Try to drink a lot of fluids. You can also talk to him about your pain. .Marland Kitchen

## 2015-02-08 ENCOUNTER — Encounter (HOSPITAL_COMMUNITY): Payer: Self-pay | Admitting: Emergency Medicine

## 2015-02-08 ENCOUNTER — Emergency Department (HOSPITAL_COMMUNITY)
Admission: EM | Admit: 2015-02-08 | Discharge: 2015-02-08 | Disposition: A | Discharge status: Home / Self Care | Payer: No Typology Code available for payment source | Attending: Emergency Medicine | Admitting: Emergency Medicine

## 2015-02-08 ENCOUNTER — Emergency Department (HOSPITAL_COMMUNITY): Payer: No Typology Code available for payment source

## 2015-02-08 ENCOUNTER — Other Ambulatory Visit: Payer: Self-pay

## 2015-02-08 DIAGNOSIS — S29001A Unspecified injury of muscle and tendon of front wall of thorax, initial encounter: Secondary | ICD-10-CM | POA: Insufficient documentation

## 2015-02-08 DIAGNOSIS — Z91040 Latex allergy status: Secondary | ICD-10-CM | POA: Diagnosis not present

## 2015-02-08 DIAGNOSIS — Y9389 Activity, other specified: Secondary | ICD-10-CM | POA: Diagnosis not present

## 2015-02-08 DIAGNOSIS — Y998 Other external cause status: Secondary | ICD-10-CM | POA: Insufficient documentation

## 2015-02-08 DIAGNOSIS — Z880 Allergy status to penicillin: Secondary | ICD-10-CM | POA: Insufficient documentation

## 2015-02-08 DIAGNOSIS — Z950 Presence of cardiac pacemaker: Secondary | ICD-10-CM | POA: Diagnosis not present

## 2015-02-08 DIAGNOSIS — Z8719 Personal history of other diseases of the digestive system: Secondary | ICD-10-CM | POA: Insufficient documentation

## 2015-02-08 DIAGNOSIS — R0789 Other chest pain: Secondary | ICD-10-CM | POA: Diagnosis not present

## 2015-02-08 DIAGNOSIS — F17210 Nicotine dependence, cigarettes, uncomplicated: Secondary | ICD-10-CM | POA: Diagnosis not present

## 2015-02-08 DIAGNOSIS — Y92410 Unspecified street and highway as the place of occurrence of the external cause: Secondary | ICD-10-CM | POA: Insufficient documentation

## 2015-02-08 DIAGNOSIS — R079 Chest pain, unspecified: Secondary | ICD-10-CM | POA: Diagnosis not present

## 2015-02-08 HISTORY — DX: Presence of cardiac pacemaker: Z95.0

## 2015-02-08 HISTORY — DX: Unspecified hemorrhoids: K64.9

## 2015-02-08 LAB — I-STAT TROPONIN, ED: Troponin i, poc: 0.01 ng/mL (ref 0.00–0.08)

## 2015-02-08 MED ORDER — HYDROCODONE-ACETAMINOPHEN 5-325 MG PO TABS
2.0000 | ORAL_TABLET | Freq: Once | ORAL | Status: DC
  Filled 2015-02-08: qty 2

## 2015-02-08 MED ORDER — MORPHINE SULFATE (PF) 4 MG/ML IV SOLN
6.0000 mg | Freq: Once | INTRAVENOUS | Status: DC
Start: 2015-02-08 — End: 2015-02-08
  Filled 2015-02-08: qty 2

## 2015-02-08 MED ORDER — KETOROLAC TROMETHAMINE 30 MG/ML IJ SOLN
30.0000 mg | Freq: Once | INTRAMUSCULAR | Status: AC
  Administered 2015-02-08: 30 mg via INTRAMUSCULAR
  Filled 2015-02-08: qty 1

## 2015-02-08 NOTE — ED Notes (Signed)
Pt left with all his belongings and ambulated out of the treatment area.  
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
## 2015-02-08 NOTE — ED Notes (Signed)
Dr. Juleen China aware of patient's allergy to morphine, Dr. Juleen China advised it is okay to administer morphine to patient.

## 2015-02-08 NOTE — ED Notes (Signed)
MD at bedside. 
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
## 2015-02-08 NOTE — ED Notes (Signed)
Dr. Edwyna Perfect made aware that patient refused vicodin and stated that "it would take 8-10 vicodin to help my pain because my tolerance is so high"

## 2015-02-26 DIAGNOSIS — Z6821 Body mass index (BMI) 21.0-21.9, adult: Secondary | ICD-10-CM | POA: Diagnosis not present

## 2015-02-26 DIAGNOSIS — Z720 Tobacco use: Secondary | ICD-10-CM | POA: Diagnosis not present

## 2015-02-26 DIAGNOSIS — Z950 Presence of cardiac pacemaker: Secondary | ICD-10-CM | POA: Diagnosis not present

## 2015-04-06 DIAGNOSIS — H5210 Myopia, unspecified eye: Secondary | ICD-10-CM | POA: Diagnosis not present

## 2015-04-26 ENCOUNTER — Emergency Department (HOSPITAL_COMMUNITY): Payer: Medicare HMO

## 2015-04-26 ENCOUNTER — Encounter (HOSPITAL_COMMUNITY): Payer: Self-pay | Admitting: Emergency Medicine

## 2015-04-26 ENCOUNTER — Emergency Department (HOSPITAL_COMMUNITY)
Admission: EM | Admit: 2015-04-26 | Discharge: 2015-04-26 | Disposition: A | Discharge status: Home / Self Care | Payer: Medicare HMO | Attending: Emergency Medicine | Admitting: Emergency Medicine

## 2015-04-26 DIAGNOSIS — S0990XA Unspecified injury of head, initial encounter: Secondary | ICD-10-CM | POA: Diagnosis not present

## 2015-04-26 DIAGNOSIS — W108XXA Fall (on) (from) other stairs and steps, initial encounter: Secondary | ICD-10-CM

## 2015-04-26 DIAGNOSIS — S20219A Contusion of unspecified front wall of thorax, initial encounter: Secondary | ICD-10-CM | POA: Diagnosis not present

## 2015-04-26 DIAGNOSIS — S199XXA Unspecified injury of neck, initial encounter: Secondary | ICD-10-CM | POA: Diagnosis not present

## 2015-04-26 DIAGNOSIS — R42 Dizziness and giddiness: Secondary | ICD-10-CM | POA: Diagnosis not present

## 2015-04-26 DIAGNOSIS — S8001XA Contusion of right knee, initial encounter: Secondary | ICD-10-CM | POA: Insufficient documentation

## 2015-04-26 DIAGNOSIS — R55 Syncope and collapse: Secondary | ICD-10-CM | POA: Diagnosis not present

## 2015-04-26 DIAGNOSIS — T07XXXA Unspecified multiple injuries, initial encounter: Secondary | ICD-10-CM

## 2015-04-26 DIAGNOSIS — R51 Headache: Secondary | ICD-10-CM | POA: Diagnosis not present

## 2015-04-26 DIAGNOSIS — Y999 Unspecified external cause status: Secondary | ICD-10-CM | POA: Insufficient documentation

## 2015-04-26 DIAGNOSIS — S99912A Unspecified injury of left ankle, initial encounter: Secondary | ICD-10-CM | POA: Diagnosis not present

## 2015-04-26 DIAGNOSIS — M542 Cervicalgia: Secondary | ICD-10-CM | POA: Diagnosis not present

## 2015-04-26 DIAGNOSIS — S99911A Unspecified injury of right ankle, initial encounter: Secondary | ICD-10-CM | POA: Diagnosis not present

## 2015-04-26 DIAGNOSIS — S8992XA Unspecified injury of left lower leg, initial encounter: Secondary | ICD-10-CM | POA: Diagnosis not present

## 2015-04-26 DIAGNOSIS — R569 Unspecified convulsions: Secondary | ICD-10-CM | POA: Diagnosis not present

## 2015-04-26 DIAGNOSIS — S9001XA Contusion of right ankle, initial encounter: Secondary | ICD-10-CM | POA: Insufficient documentation

## 2015-04-26 DIAGNOSIS — S9002XA Contusion of left ankle, initial encounter: Secondary | ICD-10-CM | POA: Diagnosis not present

## 2015-04-26 DIAGNOSIS — Y9389 Activity, other specified: Secondary | ICD-10-CM | POA: Diagnosis not present

## 2015-04-26 DIAGNOSIS — F17210 Nicotine dependence, cigarettes, uncomplicated: Secondary | ICD-10-CM | POA: Insufficient documentation

## 2015-04-26 DIAGNOSIS — S299XXA Unspecified injury of thorax, initial encounter: Secondary | ICD-10-CM | POA: Diagnosis not present

## 2015-04-26 DIAGNOSIS — R0682 Tachypnea, not elsewhere classified: Secondary | ICD-10-CM | POA: Diagnosis not present

## 2015-04-26 DIAGNOSIS — F1899 Inhalant use, unspecified with unspecified inhalant-induced disorder: Secondary | ICD-10-CM | POA: Diagnosis not present

## 2015-04-26 DIAGNOSIS — S8002XA Contusion of left knee, initial encounter: Secondary | ICD-10-CM | POA: Diagnosis not present

## 2015-04-26 DIAGNOSIS — Y929 Unspecified place or not applicable: Secondary | ICD-10-CM | POA: Diagnosis not present

## 2015-04-26 DIAGNOSIS — S8991XA Unspecified injury of right lower leg, initial encounter: Secondary | ICD-10-CM | POA: Diagnosis not present

## 2015-04-26 DIAGNOSIS — R079 Chest pain, unspecified: Secondary | ICD-10-CM | POA: Diagnosis not present

## 2015-04-26 LAB — CBC WITH DIFFERENTIAL/PLATELET
BASOS PCT: 0 %
Basophils Absolute: 0 10*3/uL (ref 0.0–0.1)
EOS ABS: 0.2 10*3/uL (ref 0.0–0.7)
EOS PCT: 3 %
HCT: 40.1 % (ref 39.0–52.0)
HEMOGLOBIN: 13.7 g/dL (ref 13.0–17.0)
Lymphocytes Relative: 28 %
Lymphs Abs: 2.4 10*3/uL (ref 0.7–4.0)
MCH: 28.8 pg (ref 26.0–34.0)
MCHC: 34.2 g/dL (ref 30.0–36.0)
MCV: 84.4 fL (ref 78.0–100.0)
MONOS PCT: 7 %
Monocytes Absolute: 0.6 10*3/uL (ref 0.1–1.0)
NEUTROS PCT: 62 %
Neutro Abs: 5.3 10*3/uL (ref 1.7–7.7)
PLATELETS: 148 10*3/uL — AB (ref 150–400)
RBC: 4.75 MIL/uL (ref 4.22–5.81)
RDW: 13.9 % (ref 11.5–15.5)
WBC: 8.6 10*3/uL (ref 4.0–10.5)

## 2015-04-26 LAB — COMPREHENSIVE METABOLIC PANEL
ALK PHOS: 64 U/L (ref 38–126)
ALT: 28 U/L (ref 17–63)
AST: 23 U/L (ref 15–41)
Albumin: 4.2 g/dL (ref 3.5–5.0)
Anion gap: 8 (ref 5–15)
BUN: 12 mg/dL (ref 6–20)
CALCIUM: 9.3 mg/dL (ref 8.9–10.3)
CO2: 24 mmol/L (ref 22–32)
CREATININE: 0.95 mg/dL (ref 0.61–1.24)
Chloride: 109 mmol/L (ref 101–111)
Glucose, Bld: 107 mg/dL — ABNORMAL HIGH (ref 65–99)
Potassium: 3.4 mmol/L — ABNORMAL LOW (ref 3.5–5.1)
Sodium: 141 mmol/L (ref 135–145)
Total Bilirubin: 0.6 mg/dL (ref 0.3–1.2)
Total Protein: 7.1 g/dL (ref 6.5–8.1)

## 2015-04-26 LAB — RAPID URINE DRUG SCREEN, HOSP PERFORMED
AMPHETAMINES: NOT DETECTED
Barbiturates: NOT DETECTED
Benzodiazepines: NOT DETECTED
Cocaine: NOT DETECTED
OPIATES: NOT DETECTED
Tetrahydrocannabinol: POSITIVE — AB

## 2015-04-26 LAB — TROPONIN I

## 2015-04-26 LAB — ETHANOL

## 2015-04-26 MED ORDER — NAPROXEN 250 MG PO TABS
500.0000 mg | ORAL_TABLET | Freq: Once | ORAL | Status: AC
  Administered 2015-04-26: 500 mg via ORAL
  Filled 2015-04-26: qty 2

## 2015-04-26 MED ORDER — NAPROXEN 500 MG PO TABS: ORAL_TABLET | ORAL | Status: AC

## 2015-04-26 MED ORDER — CYCLOBENZAPRINE HCL 10 MG PO TABS
10.0000 mg | ORAL_TABLET | Freq: Once | ORAL | Status: AC
  Administered 2015-04-26: 10 mg via ORAL
  Filled 2015-04-26: qty 1

## 2015-04-26 MED ORDER — CYCLOBENZAPRINE HCL 10 MG PO TABS: 10.0000 mg | ORAL_TABLET | Freq: Three times a day (TID) | ORAL | Status: AC | PRN
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
## 2015-04-26 NOTE — ED Notes (Signed)
Leta JunglingMarcia from [facility] called stated that we would get the official word from [place] but that she saw nothing out of the norm for the pacemaker for this patient
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
## 2015-04-26 NOTE — ED Notes (Signed)
Pt continues in x-ray

## 2015-04-26 NOTE — ED Notes (Signed)
Call to [facility] poc staci- informed interrogated-She will send to resident who will call [phone]

## 2015-04-26 NOTE — ED Notes (Signed)
EMS called to home regarding seizure - pt unconscience when they got there- pt denies shortness of breath- was trying to sleep and couldn't breathe

## 2015-04-26 NOTE — ED Provider Notes (Signed)
CSN: [id number]     Arrival date & time 04/26/15  0242 History   First MD Initiated Contact with Patient 04/26/15 0255    Chief Complaint  Patient presents with  . Seizures    no history but has pacemaker     (Consider location/radiation/quality/duration/timing/severity/associated sxs/prior Treatment) HPI patient states he was fine all day. He states he went to bed around 45 minutes ago and he got acutely short of breath. He states he felt like his heart was beating too slow. He states he sat up and the shortness of breath went away however he felt very dizzy. He states he fell in his room and then he got up and was trying to go downstairs to get his mother and he fell down about 13 steps. His mother states she heard him fall and when she got there he had some jerking for a few seconds and then was unresponsive. She states he had a pulse and he was breathing. She states he was still unresponsive when EMS got there. He complains of chest pain since he fell down the steps,  and he has like rug burn on his left chest but his whole chest hurts. He denies headache, nausea, or vomiting. He denies blurred vision or neck pain. He states his knees and his ankles hurt.  Patient states he had a pacemaker placed in May 2016 in [place]. He states it was because his heart was stopping. He states it was last checked about 4 months ago. Patient states "every time my PO wands my chest I have trouble with my pacemaker". He states he had an appointment with his PO this morning about 9 AM. He states his heart rate is usually in the 90s.  PCP Dr Olena Leatherwood Cardiology Dr Molly Maduro  Past Medical History  Diagnosis Date  . Pacemaker   . Hemorrhoid    Past Surgical History  Procedure Laterality Date  . Hernia repair    . Leg surgery     No family history on file. Social History  Substance Use Topics  . Smoking status: Current Every Day Smoker -- 0.50 packs/day    Types: Cigarettes  . Smokeless  tobacco: None  . Alcohol Use: Yes     Comment: socially  lives with mother  On disability for learning disorder Smokes one half packs per day  Review of Systems  All other systems reviewed and are negative.     Allergies  Coconut flavor; Morphine and related; Penicillins; Tomato; Vicodin; Amoxicillin; Latex; and Tramadol  Home Medications   Prior to Admission medications   Medication Sig Start Date End Date Taking? Authorizing Provider  azithromycin (ZITHROMAX) 250 MG tablet Take 1 tablet (250 mg total) by mouth daily. Take first 2 tablets together, then 1 every day until finished. Patient not taking: Reported on 02/08/2015 06/25/12   Raeford Razor, MD  cyclobenzaprine (FLEXERIL) 10 MG tablet Take 1 tablet (10 mg total) by mouth 3 (three) times daily as needed for muscle spasms. 04/26/15   Devoria Albe, MD  naproxen (NAPROSYN) 500 MG tablet Take 1 po BID with food prn pain 04/26/15   Devoria Albe, MD  oxyCODONE-acetaminophen (PERCOCET/ROXICET) 5-325 MG per tablet Take 1 tablet by mouth every 4 (four) hours as needed for pain. Patient not taking: Reported on 02/08/2015 06/25/12   Raeford Razor, MD  permethrin (ACTICIN) 5 % cream Thoroughly wash and dry skin. Massage the cream into the skin from the head to the soles of the feet, paying  special attention to creases in the skin, hands, feet, between fingers and toes, underarms, and groin. Leave the permethrin cream on the skin for 8 to 14 hours. Wash off by taking a shower or bath. Change into clean clothes. After treatment, itching may continue for up to 4 weeks. Patient not taking: Reported on 02/08/2015 12/02/13   Azalia Bilis, MD  promethazine-codeine [facility] WITH CODEINE) 6.25-10 MG/5ML syrup Take 5 mLs by mouth every 4 (four) hours as needed for cough. Patient not taking: Reported on 02/08/2015 10/13/11   Burgess Amor, PA-C  ranitidine (ZANTAC) 150 MG capsule Take 1 capsule (150 mg total) by mouth 2 (two) times daily. Patient not taking:  Reported on 02/08/2015 10/03/11   Bethann Berkshire, MD   BP 132/90 mmHg  Pulse 89  Temp(Src) 98.2 F (36.8 C) (Oral)  Resp 16  Ht  (1.854 m)  Wt 175 lb (79.379 kg)  BMI 23.09 kg/m2  SpO2 95%  Vital signs normal   Physical Exam  Constitutional: He is oriented to person, place, and time. He appears well-developed and well-nourished.  Non-toxic appearance. He does not appear ill. No distress.  HENT:  Head: Normocephalic and atraumatic.  Right Ear: External ear normal.  Left Ear: External ear normal.  Nose: Nose normal. No mucosal edema or rhinorrhea.  Mouth/Throat: Oropharynx is clear and moist and mucous membranes are normal. No dental abscesses or uvula swelling.  No trauma noted to his tongue  Eyes: Conjunctivae and EOM are normal. Pupils are equal, round, and reactive to light.  Neck: Normal range of motion and full passive range of motion without pain. Neck supple.  Cardiovascular: Normal rate, regular rhythm and normal heart sounds.  Exam reveals no gallop and no friction rub.   No murmur heard. Patient's noted to have diffuse redness of these left anterior chest involving his nipple area consistent with a rug burn. He is also tender diffusely in his right anterior chest.  Pulmonary/Chest: Effort normal and breath sounds normal. No respiratory distress. He has no wheezes. He has no rhonchi. He has no rales. He exhibits tenderness. He exhibits no crepitus.  Abdominal: Soft. Normal appearance and bowel sounds are normal. He exhibits no distension. There is no tenderness. There is no rebound and no guarding.  Musculoskeletal: Normal range of motion. He exhibits no edema or tenderness.  Moves all extremities well. Patient however states he has pain in both knees and both ankles. There is no swelling or abrasions seen. There is no joint effusion seen. He's tender diffusely in all of those joints.  Neurological: He is alert and oriented to person, place, and time. He has normal strength.  No cranial nerve deficit.  Skin: Skin is warm, dry and intact. No rash noted. No erythema. No pallor.  Has nicotine staining to fingers  Psychiatric: He has a normal mood and affect. His speech is normal and behavior is normal. His mood appears not anxious.  Nursing note and vitals reviewed.   ED Course  Procedures (including critical care time)  Medications  naproxen (NAPROSYN) tablet 500 mg (not administered)  cyclobenzaprine (FLEXERIL) tablet 10 mg (not administered)   Patient's area of pain were x-rayed or CT'd.  03:58 [employer] rep states no concerns with his pacemaker, states he has had 5 episodes of SVT since November, but none in the past 24 hours  Labs Review Results for orders placed or performed during the hospital encounter of 04/26/15  Comprehensive metabolic panel  Result Value Ref Range  Sodium 141 135 - 145 mmol/L   Potassium 3.4 (L) 3.5 - 5.1 mmol/L   Chloride 109 101 - 111 mmol/L   CO2 24 22 - 32 mmol/L   Glucose, Bld 107 (H) 65 - 99 mg/dL   BUN 12 6 - 20 mg/dL   Creatinine, Ser 1.61 0.61 - 1.24 mg/dL   Calcium 9.3 8.9 - 09.6 mg/dL   Total Protein 7.1 6.5 - 8.1 g/dL   Albumin 4.2 3.5 - 5.0 g/dL   AST 23 15 - 41 U/L   ALT 28 17 - 63 U/L   Alkaline Phosphatase 64 38 - 126 U/L   Total Bilirubin 0.6 0.3 - 1.2 mg/dL   GFR calc non Af Amer >60 >60 mL/min   GFR calc Af Amer >60 >60 mL/min   Anion gap 8 5 - 15  CBC with Differential  Result Value Ref Range   WBC 8.6 4.0 - 10.5 K/uL   RBC 4.75 4.22 - 5.81 MIL/uL   Hemoglobin 13.7 13.0 - 17.0 g/dL   HCT 04.5 40.9 - 81.1 %   MCV 84.4 78.0 - 100.0 fL   MCH 28.8 26.0 - 34.0 pg   MCHC 34.2 30.0 - 36.0 g/dL   RDW 91.4 78.2 - 95.6 %   Platelets 148 (L) 150 - 400 K/uL   Neutrophils Relative % 62 %   Neutro Abs 5.3 1.7 - 7.7 K/uL   Lymphocytes Relative 28 %   Lymphs Abs 2.4 0.7 - 4.0 K/uL   Monocytes Relative 7 %   Monocytes Absolute 0.6 0.1 - 1.0 K/uL   Eosinophils Relative 3 %   Eosinophils Absolute 0.2 0.0  - 0.7 K/uL   Basophils Relative 0 %   Basophils Absolute 0.0 0.0 - 0.1 K/uL  Ethanol  Result Value Ref Range   Alcohol, Ethyl (B) <5 <5 mg/dL  Urine rapid drug screen (hosp performed)  Result Value Ref Range   Opiates NONE DETECTED NONE DETECTED   Cocaine NONE DETECTED NONE DETECTED   Benzodiazepines NONE DETECTED NONE DETECTED   Amphetamines NONE DETECTED NONE DETECTED   Tetrahydrocannabinol POSITIVE (A) NONE DETECTED   Barbiturates NONE DETECTED NONE DETECTED  Troponin I  Result Value Ref Range   Troponin I <0.03 <0.031 ng/mL   Laboratory interpretation all normal except + UDS     Imaging Review Dg Ribs Bilateral W/chest  04/26/2015  CLINICAL DATA:  Larey Seat down stairs at home. RIGHT constant chest pain. EXAM: BILATERAL RIBS AND CHEST - 4+ VIEW COMPARISON:  Chest radiograph February 08, 2015 FINDINGS: No fracture or other bone lesions are seen involving the ribs. There is no evidence of pneumothorax or pleural effusion. Both lungs are clear. Heart size and mediastinal contours are within normal limits. Dual lead LEFT cardiac pacemaker in situ. IMPRESSION: Negative. Electronically Signed   By: Awilda Metro M.D.   On: 04/26/2015 06:44   Dg Ankle Complete Left  04/26/2015  CLINICAL DATA:  Larey Seat down stairs at home EXAM: LEFT ANKLE COMPLETE - 3+ VIEW COMPARISON:  None. FINDINGS: There is no evidence of fracture, dislocation, or joint effusion. There is no evidence of arthropathy or other focal bone abnormality. Soft tissues are unremarkable. IMPRESSION: Negative. Electronically Signed   By: Ellery Plunk M.D.   On: 04/26/2015 06:43   Dg Ankle Complete Right  04/26/2015  CLINICAL DATA:  Larey Seat down stairs at home. EXAM: RIGHT ANKLE - COMPLETE 3+ VIEW COMPARISON:  None. FINDINGS: There is no evidence of fracture, dislocation, or joint effusion.  There is no evidence of arthropathy or other focal bone abnormality. Soft tissues are unremarkable. IMPRESSION: Negative. Electronically Signed    By: Ellery Plunkaniel R Mitchell M.D.   On: 04/26/2015 06:43   Ct Head Wo Contrast Ct Cervical Spine Wo Contrast  04/26/2015  CLINICAL DATA:  Larey SeatFell down stairs at home. Sharp intermittent neck pain radiating to shoulders. Headache. History of pacemaker. EXAM: CT HEAD WITHOUT CONTRAST CT CERVICAL SPINE WITHOUT CONTRAST TECHNIQUE: Multidetector CT imaging of the head and cervical spine was performed following the standard protocol without intravenous contrast. Multiplanar CT image reconstructions of the cervical spine were also generated. COMPARISON:  None. FINDINGS: CT HEAD FINDINGS INTRACRANIAL CONTENTS: The ventricles and sulci are normal. No intraparenchymal hemorrhage, mass effect nor midline shift. No acute large vascular territory infarcts. No abnormal extra-axial fluid collections. Basal cisterns are patent. ORBITS: The included ocular globes and orbital contents are normal. SINUSES: The mastoid aircells and included paranasal sinuses are well-aerated. SKULL/SOFT TISSUES: No skull fracture. No significant soft tissue swelling. Multiple absent teeth and tongue piercing noted on the scout view. CT CERVICAL SPINE FINDINGS OSSEOUS STRUCTURES: Cervical vertebral bodies and posterior elements are intact and aligned with straightened cervical lordosis. Intervertebral disc heights preserved. No destructive bony lesions. C1-2 articulation maintained. SOFT TISSUES: Included prevertebral and paraspinal soft tissues are unremarkable. IMPRESSION: Negative CT head. Negative CT cervical spine. Electronically Signed   By: Awilda Metroourtnay  Bloomer M.D.   On: 04/26/2015 05:55   Dg Knee Complete 4 Views Left  04/26/2015  CLINICAL DATA:  Larey SeatFell down stairs at home EXAM: LEFT KNEE - COMPLETE 4+ VIEW COMPARISON:  None. FINDINGS: There is no evidence of fracture, dislocation, or joint effusion. There is no evidence of arthropathy or other focal bone abnormality. Soft tissues are unremarkable. IMPRESSION: Negative. Electronically Signed   By:  Ellery Plunkaniel R Mitchell M.D.   On: 04/26/2015 06:44   Dg Knee Complete 4 Views Right  04/26/2015  CLINICAL DATA:  Larey SeatFell down stairs at home EXAM: RIGHT KNEE - COMPLETE 4+ VIEW COMPARISON:  None. FINDINGS: There is no evidence of fracture, dislocation, or joint effusion. There is no evidence of arthropathy or other focal bone abnormality. Soft tissues are unremarkable. IMPRESSION: Negative. Electronically Signed   By: Ellery Plunkaniel R Mitchell M.D.   On: 04/26/2015 06:44   I have personally reviewed and evaluated these images and lab results as part of my medical decision-making.   EKG Interpretation   Date/Time:  Tuesday April 26 2015 02:56:41 EDT Ventricular Rate:  85 PR Interval:  181 QRS Duration: 83 QT Interval:  360 QTC Calculation: 428 R Axis:   85 Text Interpretation:  Age not entered, assumed to be  35 years old for  purpose of ECG interpretation Sinus rhythm ST elev, probable normal early  repol pattern Right axis deviation No significant change since last  tracing 08 Feb 2015 Confirmed by [facility]  MD-I, Odena Mcquaid ([id number]) on 04/26/2015  3:43:03 AM      MDM   Final diagnoses:  Fall down stairs, initial encounter  Syncope, unspecified syncope type  Multiple contusions   New Prescriptions   CYCLOBENZAPRINE (FLEXERIL) 10 MG TABLET    Take 1 tablet (10 mg total) by mouth 3 (three) times daily as needed for muscle spasms.   NAPROXEN (NAPROSYN) 500 MG TABLET    Take 1 po BID with food prn pain    Plan discharge  Devoria AlbeIva Avraj Lindroth, MD, Concha PyoFACEP   Olivier Frayre, MD 04/26/15 [id number]

## 2015-04-26 NOTE — ED Notes (Signed)
blanket for mother

## 2015-04-26 NOTE — ED Notes (Signed)
Dr.Knapp at bedside  

## 2015-04-26 NOTE — ED Notes (Signed)
Pt request hospital socks

## 2015-04-26 NOTE — ED Notes (Signed)
Mother to nurses station- reports that pt does not know manufacterer of pacemaker- however she believes that it is [employer]

## 2015-07-07 DIAGNOSIS — F1220 Cannabis dependence, uncomplicated: Secondary | ICD-10-CM | POA: Diagnosis not present

## 2015-08-02 DIAGNOSIS — F1220 Cannabis dependence, uncomplicated: Secondary | ICD-10-CM | POA: Diagnosis not present

## 2015-08-09 DIAGNOSIS — F1220 Cannabis dependence, uncomplicated: Secondary | ICD-10-CM | POA: Diagnosis not present

## 2015-08-23 DIAGNOSIS — F1220 Cannabis dependence, uncomplicated: Secondary | ICD-10-CM | POA: Diagnosis not present

## 2015-08-30 DIAGNOSIS — F1220 Cannabis dependence, uncomplicated: Secondary | ICD-10-CM | POA: Diagnosis not present

## 2015-09-06 DIAGNOSIS — F1220 Cannabis dependence, uncomplicated: Secondary | ICD-10-CM | POA: Diagnosis not present

## 2015-09-13 DIAGNOSIS — F1220 Cannabis dependence, uncomplicated: Secondary | ICD-10-CM | POA: Diagnosis not present

## 2015-09-20 DIAGNOSIS — F1220 Cannabis dependence, uncomplicated: Secondary | ICD-10-CM | POA: Diagnosis not present

## 2015-09-27 DIAGNOSIS — F1220 Cannabis dependence, uncomplicated: Secondary | ICD-10-CM | POA: Diagnosis not present

## 2015-09-28 DIAGNOSIS — I495 Sick sinus syndrome: Secondary | ICD-10-CM | POA: Diagnosis not present

## 2015-09-28 DIAGNOSIS — Z950 Presence of cardiac pacemaker: Secondary | ICD-10-CM | POA: Diagnosis not present

## 2015-10-04 DIAGNOSIS — F1220 Cannabis dependence, uncomplicated: Secondary | ICD-10-CM | POA: Diagnosis not present

## 2016-01-23 ENCOUNTER — Encounter (HOSPITAL_COMMUNITY): Payer: Self-pay | Admitting: Emergency Medicine

## 2016-01-23 ENCOUNTER — Emergency Department (HOSPITAL_COMMUNITY)
Admission: EM | Admit: 2016-01-23 | Discharge: 2016-01-23 | Disposition: A | Discharge status: Home / Self Care | Payer: Medicare HMO | Attending: Dermatology | Admitting: Dermatology

## 2016-01-23 DIAGNOSIS — R42 Dizziness and giddiness: Secondary | ICD-10-CM | POA: Diagnosis not present

## 2016-01-23 DIAGNOSIS — R404 Transient alteration of awareness: Secondary | ICD-10-CM | POA: Diagnosis not present

## 2016-01-23 DIAGNOSIS — R55 Syncope and collapse: Secondary | ICD-10-CM | POA: Diagnosis not present

## 2016-01-23 DIAGNOSIS — F17210 Nicotine dependence, cigarettes, uncomplicated: Secondary | ICD-10-CM | POA: Insufficient documentation

## 2016-01-23 DIAGNOSIS — R061 Stridor: Secondary | ICD-10-CM | POA: Diagnosis not present

## 2016-01-23 NOTE — ED Triage Notes (Signed)
Patient states "I've been vomiting off and on since January 1st and today I was walking to my probations officers and I started vomiting then I passed out." Complaining of dizziness at triage. Denies pain.

## 2016-01-23 NOTE — ED Notes (Signed)
Pt refusing to have blood work done by phlebotomy at this time. PT back to waiting room.

## 2016-01-23 NOTE — ED Notes (Signed)
Patient does not answer in waiting room when called

## 2016-04-05 DIAGNOSIS — R6889 Other general symptoms and signs: Secondary | ICD-10-CM | POA: Diagnosis not present

## 2016-04-11 DIAGNOSIS — R1011 Right upper quadrant pain: Secondary | ICD-10-CM | POA: Diagnosis not present

## 2016-04-11 DIAGNOSIS — Z0000 Encounter for general adult medical examination without abnormal findings: Secondary | ICD-10-CM | POA: Diagnosis not present

## 2016-04-11 DIAGNOSIS — F330 Major depressive disorder, recurrent, mild: Secondary | ICD-10-CM | POA: Diagnosis not present

## 2016-04-11 DIAGNOSIS — Z1389 Encounter for screening for other disorder: Secondary | ICD-10-CM | POA: Diagnosis not present

## 2016-04-11 DIAGNOSIS — R6889 Other general symptoms and signs: Secondary | ICD-10-CM | POA: Diagnosis not present

## 2016-04-11 DIAGNOSIS — I10 Essential (primary) hypertension: Secondary | ICD-10-CM | POA: Diagnosis not present

## 2016-04-11 DIAGNOSIS — Z6824 Body mass index (BMI) 24.0-24.9, adult: Secondary | ICD-10-CM | POA: Diagnosis not present

## 2016-05-03 DIAGNOSIS — M545 Low back pain: Secondary | ICD-10-CM | POA: Diagnosis not present

## 2016-05-03 DIAGNOSIS — G894 Chronic pain syndrome: Secondary | ICD-10-CM | POA: Diagnosis not present

## 2016-05-03 DIAGNOSIS — M797 Fibromyalgia: Secondary | ICD-10-CM | POA: Diagnosis not present

## 2016-05-03 DIAGNOSIS — M79606 Pain in leg, unspecified: Secondary | ICD-10-CM | POA: Diagnosis not present

## 2016-05-03 DIAGNOSIS — I10 Essential (primary) hypertension: Secondary | ICD-10-CM | POA: Diagnosis not present

## 2016-05-03 DIAGNOSIS — Z79891 Long term (current) use of opiate analgesic: Secondary | ICD-10-CM | POA: Diagnosis not present

## 2016-05-03 DIAGNOSIS — M79605 Pain in left leg: Secondary | ICD-10-CM | POA: Diagnosis not present

## 2016-05-03 DIAGNOSIS — F330 Major depressive disorder, recurrent, mild: Secondary | ICD-10-CM | POA: Diagnosis not present

## 2016-05-03 DIAGNOSIS — F419 Anxiety disorder, unspecified: Secondary | ICD-10-CM | POA: Diagnosis not present

## 2016-05-03 DIAGNOSIS — M79604 Pain in right leg: Secondary | ICD-10-CM | POA: Diagnosis not present

## 2016-05-16 IMAGING — DX DG KNEE COMPLETE 4+V*L*
4 series · 4 of 4 positions shown · non-contrast
Comparison: None.

CLINICAL DATA: Fell down stairs at home

EXAM:
LEFT KNEE - COMPLETE 4+ VIEW

[knee ap (1 of 3)]
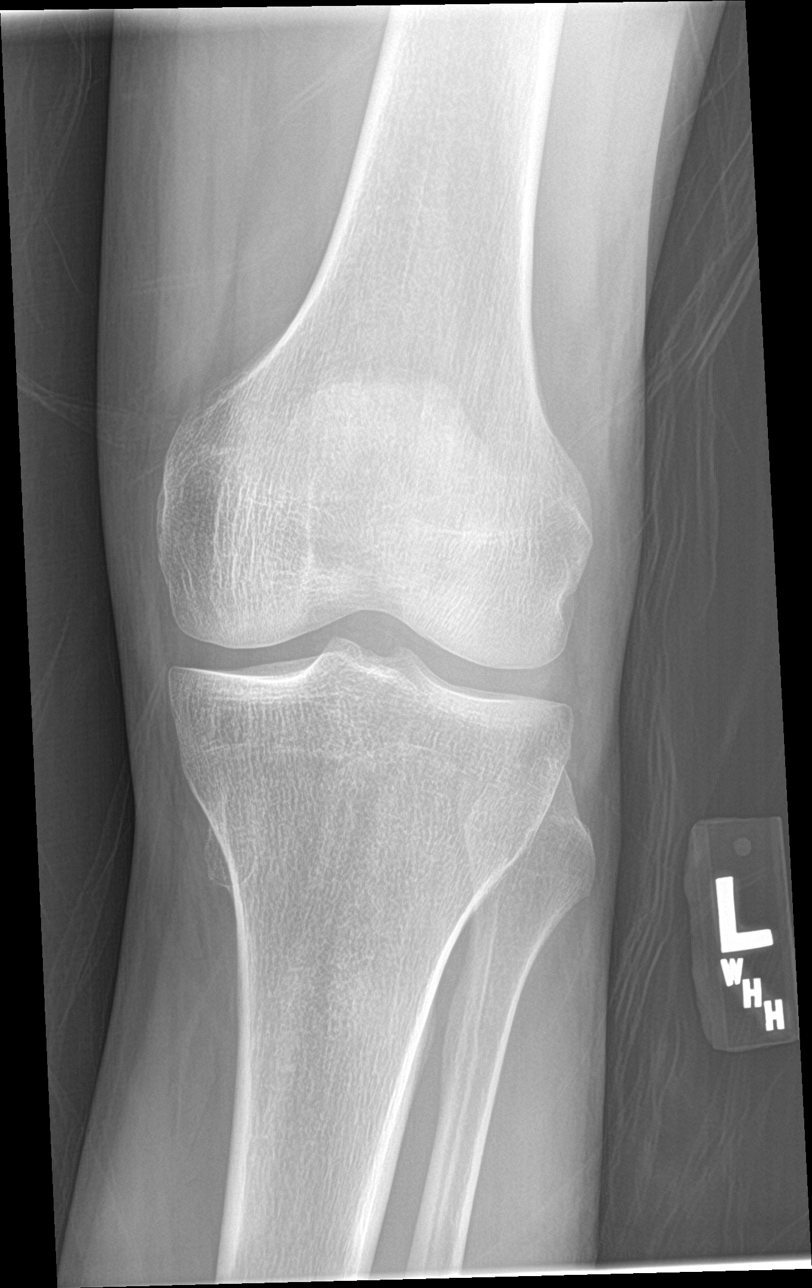

[knee ap (2 of 3)]
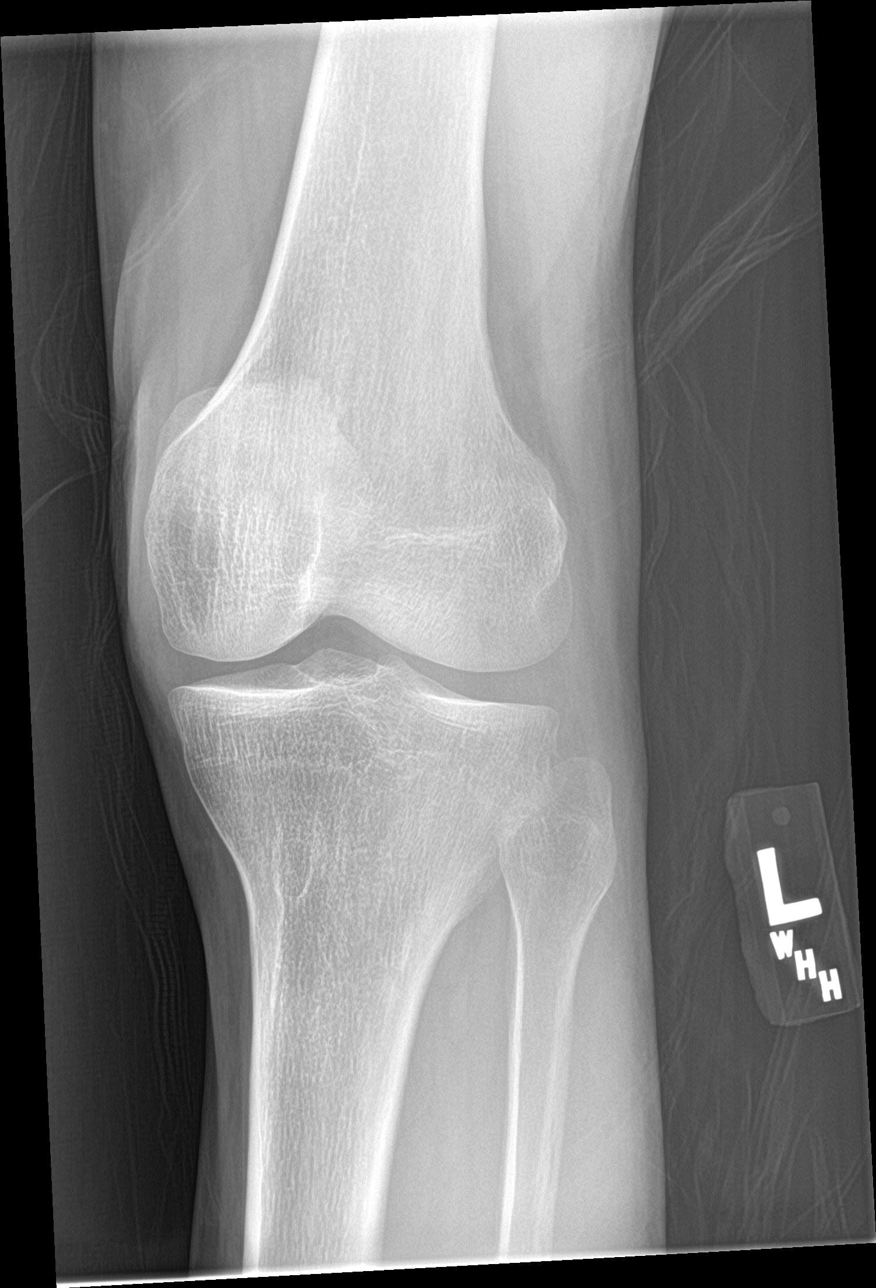

[knee ap (3 of 3)]
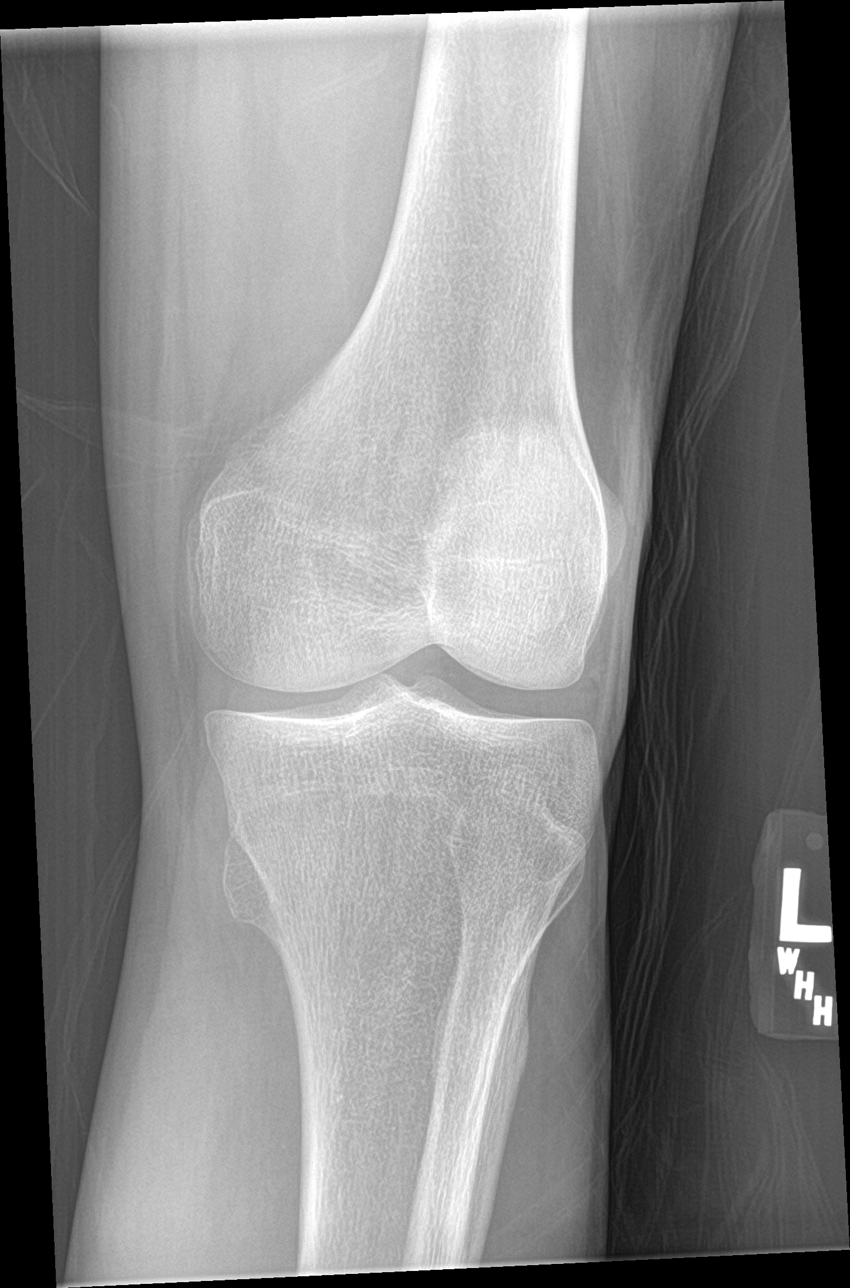

[knee lat]
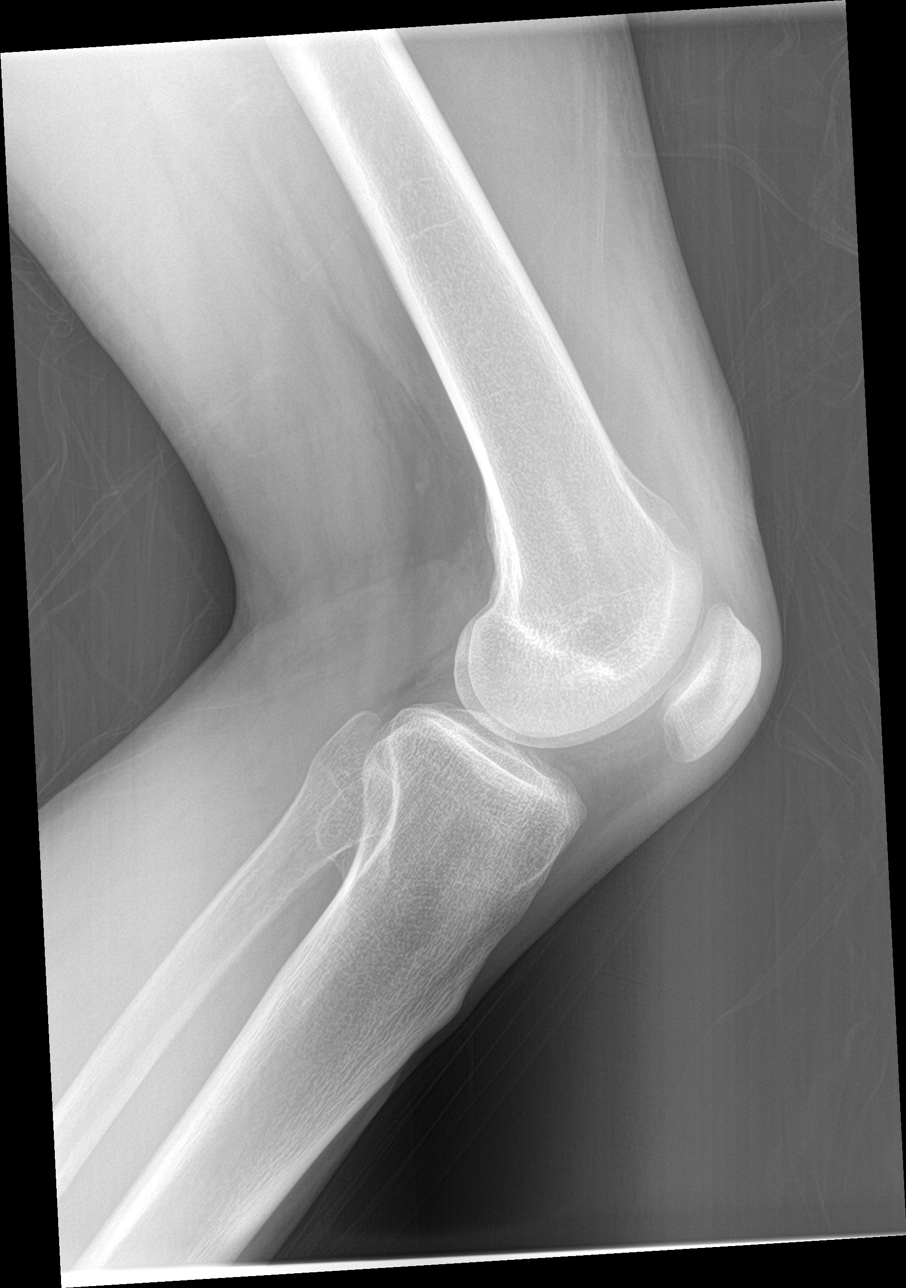

[4 of 4 positions shown; findings below may reference images not displayed]

FINDINGS: There is no evidence of fracture, dislocation, or joint effusion.
There is no evidence of arthropathy or other focal bone abnormality.
Soft tissues are unremarkable.
IMPRESSION: Negative.

## 2016-05-16 IMAGING — DX DG ANKLE COMPLETE 3+V*R*
3 series · 3 of 3 positions shown · non-contrast
Comparison: None.

CLINICAL DATA: Fell down stairs at home.

EXAM:
RIGHT ANKLE - COMPLETE 3+ VIEW

[ankle ap]
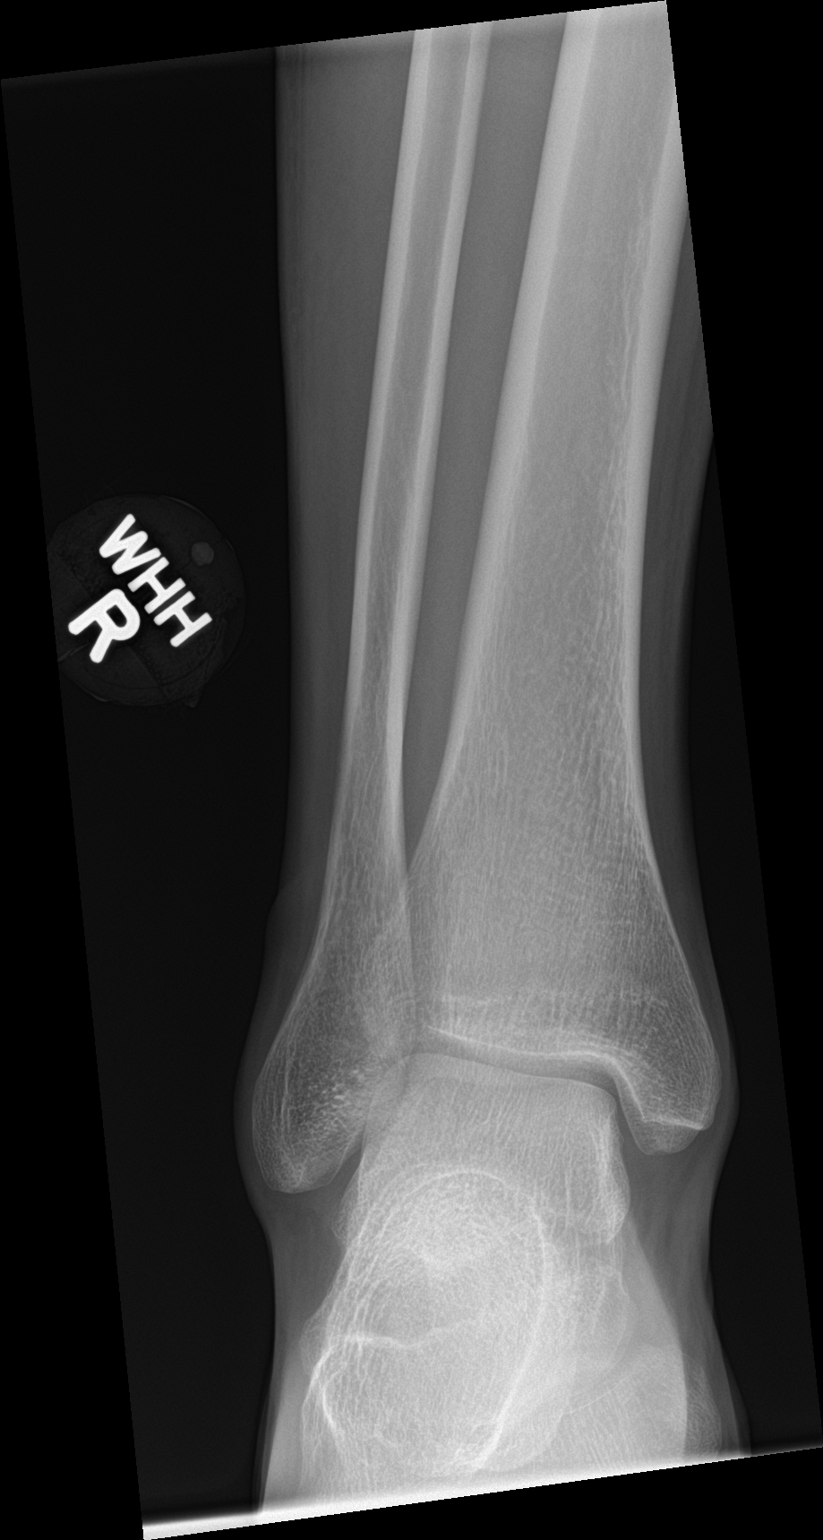

[ankle lat]
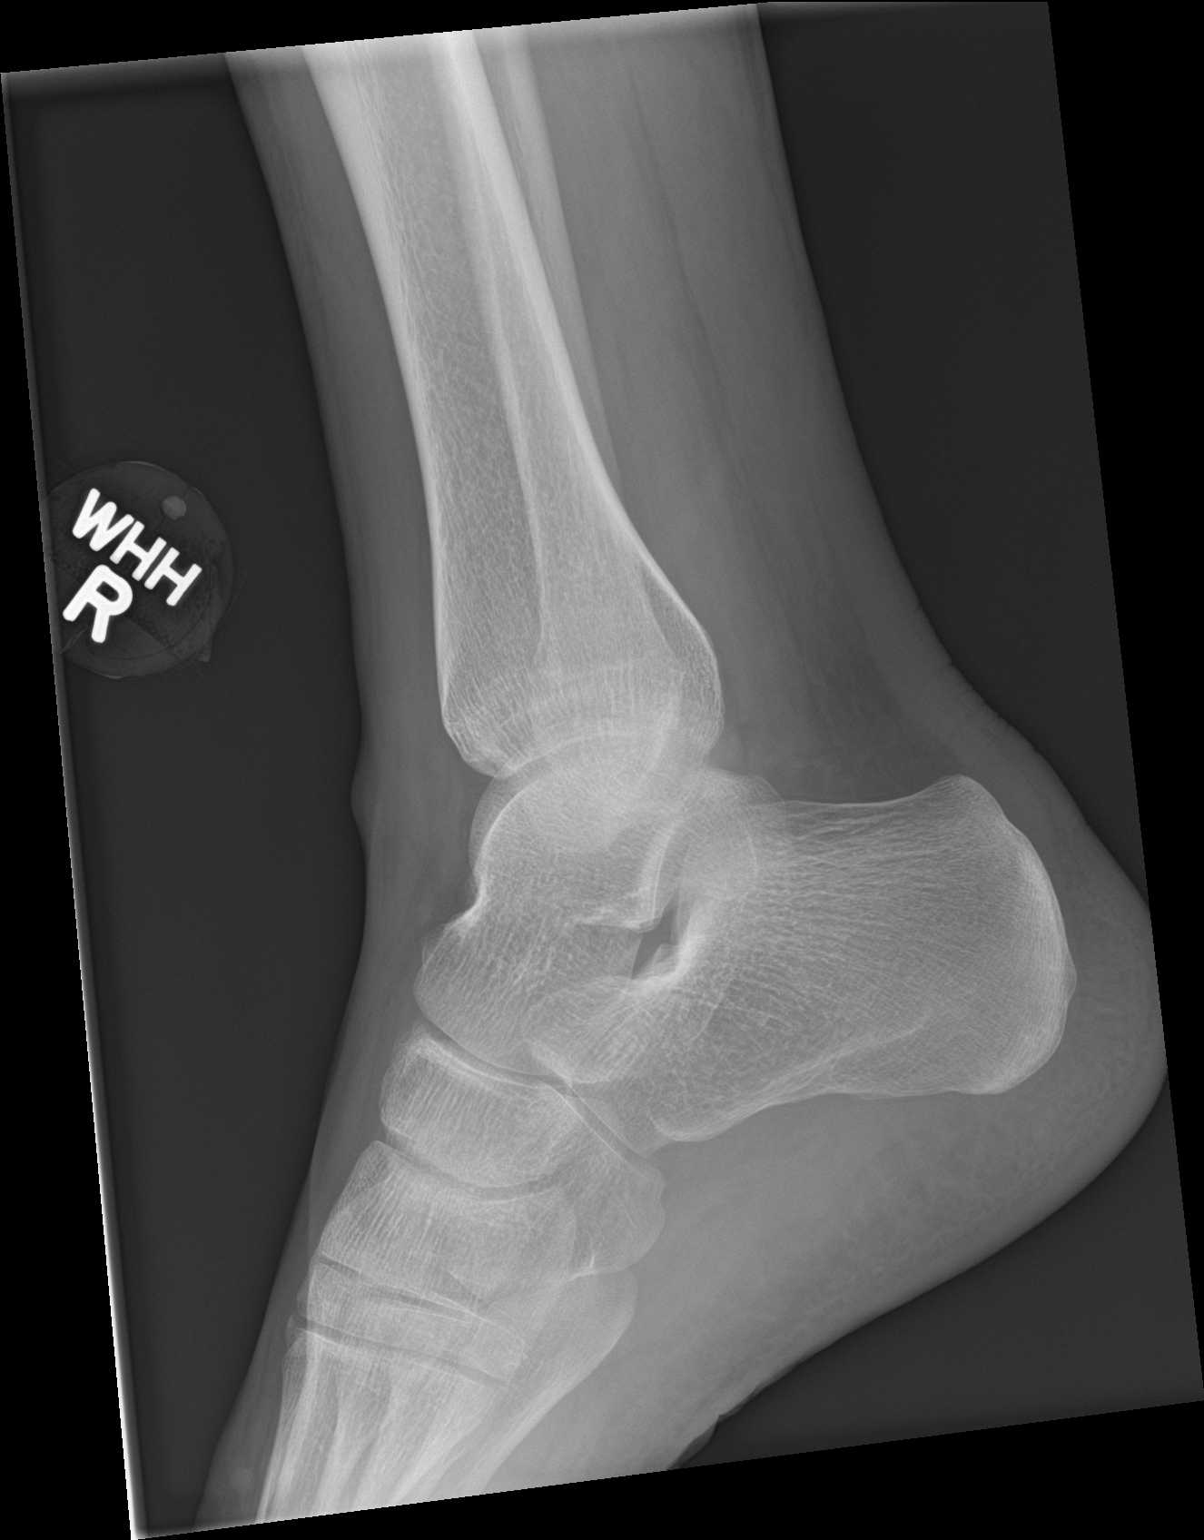

[ankle obl]
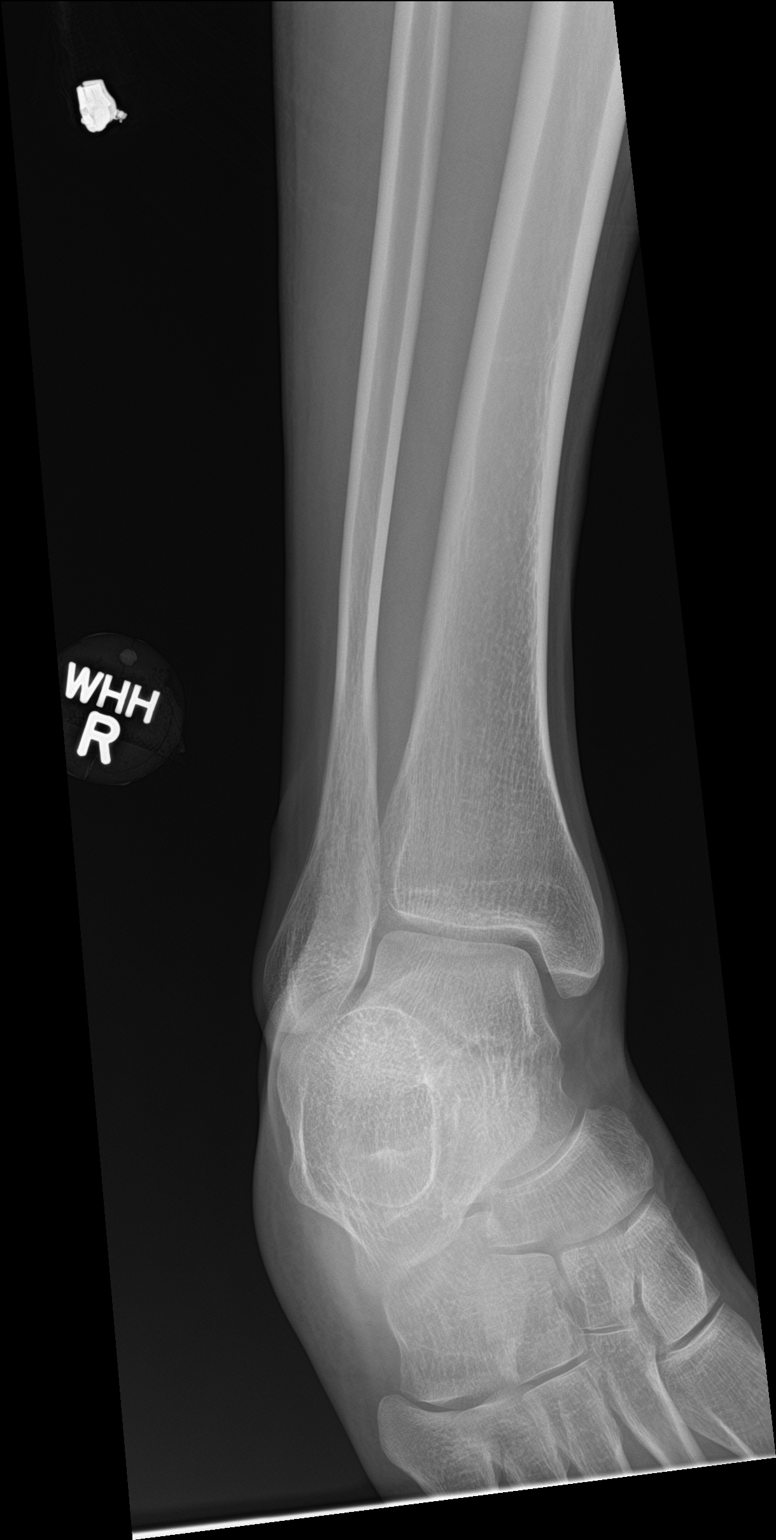

[3 of 3 positions shown; findings below may reference images not displayed]

FINDINGS: There is no evidence of fracture, dislocation, or joint effusion.
There is no evidence of arthropathy or other focal bone abnormality.
Soft tissues are unremarkable.
IMPRESSION: Negative.

## 2016-09-20 DIAGNOSIS — I495 Sick sinus syndrome: Secondary | ICD-10-CM | POA: Diagnosis not present
